# Patient Record
Sex: Female | Born: 1980 | Race: White | Hispanic: No | Smoking: Former smoker
Health system: Southern US, Community
[De-identification: ages and names within clinical notes are randomized; demographics above are authoritative.]

## PROBLEM LIST (undated history)

## (undated) DIAGNOSIS — F32A Depression, unspecified: Secondary | ICD-10-CM

## (undated) DIAGNOSIS — G8929 Other chronic pain: Secondary | ICD-10-CM

## (undated) DIAGNOSIS — M542 Cervicalgia: Secondary | ICD-10-CM

## (undated) DIAGNOSIS — K635 Polyp of colon: Secondary | ICD-10-CM

## (undated) DIAGNOSIS — R011 Cardiac murmur, unspecified: Secondary | ICD-10-CM

## (undated) DIAGNOSIS — K219 Gastro-esophageal reflux disease without esophagitis: Secondary | ICD-10-CM

## (undated) DIAGNOSIS — I10 Essential (primary) hypertension: Secondary | ICD-10-CM

## (undated) DIAGNOSIS — R519 Headache, unspecified: Secondary | ICD-10-CM

## (undated) DIAGNOSIS — Z5189 Encounter for other specified aftercare: Secondary | ICD-10-CM

## (undated) DIAGNOSIS — K5090 Crohn's disease, unspecified, without complications: Secondary | ICD-10-CM

## (undated) DIAGNOSIS — F419 Anxiety disorder, unspecified: Secondary | ICD-10-CM

## (undated) DIAGNOSIS — G43909 Migraine, unspecified, not intractable, without status migrainosus: Secondary | ICD-10-CM

## (undated) HISTORY — DX: Encounter for other specified aftercare: Z51.89

## (undated) HISTORY — DX: Gastro-esophageal reflux disease without esophagitis: K21.9

## (undated) HISTORY — DX: Polyp of colon: K63.5

## (undated) HISTORY — DX: Cervicalgia: M54.2

## (undated) HISTORY — DX: Anxiety disorder, unspecified: F41.9

## (undated) HISTORY — DX: Headache, unspecified: R51.9

## (undated) HISTORY — DX: Essential (primary) hypertension: I10

## (undated) HISTORY — DX: Cardiac murmur, unspecified: R01.1

## (undated) HISTORY — DX: Crohn's disease, unspecified, without complications: K50.90

## (undated) HISTORY — DX: Other chronic pain: G89.29

## (undated) HISTORY — PX: CERVICAL BIOPSY  W/ LOOP ELECTRODE EXCISION: SUR135

## (undated) HISTORY — PX: TONSILLECTOMY AND ADENOIDECTOMY: SUR1326

## (undated) HISTORY — DX: Migraine, unspecified, not intractable, without status migrainosus: G43.909

## (undated) HISTORY — PX: LYMPHADENECTOMY: SHX15

## (undated) HISTORY — DX: Depression, unspecified: F32.A

---

## 2015-07-11 HISTORY — PX: COLONOSCOPY: SHX174

## 2016-03-31 ENCOUNTER — Encounter: Payer: Self-pay | Admitting: Gastroenterology

## 2018-01-23 DIAGNOSIS — K50119 Crohn's disease of large intestine with unspecified complications: Secondary | ICD-10-CM | POA: Insufficient documentation

## 2018-01-23 DIAGNOSIS — D649 Anemia, unspecified: Secondary | ICD-10-CM | POA: Insufficient documentation

## 2019-02-14 ENCOUNTER — Encounter: Payer: Self-pay | Admitting: Gastroenterology

## 2019-10-28 MED FILL — LOSARTAN POTASSIUM 50 MG TA: 50 | 90 days supply | Qty: 90 | Fill #0

## 2019-10-28 MED FILL — SUMATRIPTAN SUCC 100 MG TAB: 100 | 30 days supply | Qty: 8 | Fill #0

## 2019-10-28 MED FILL — PREDNISONE 10 MG TABS: 10 | 30 days supply | Qty: 100 | Fill #0

## 2019-10-28 MED FILL — AMITRIPTYLINE HCL 50 MG TAB: 50 | 30 days supply | Qty: 30 | Fill #0

## 2019-10-28 MED FILL — LIALDA 1.2 GM TABLET SA: 1.2 | 30 days supply | Qty: 120 | Fill #0

## 2019-10-29 MED FILL — VIT D2 1.25 MG (50,000 UNIT: 1.25 MG | 84 days supply | Qty: 12 | Fill #0

## 2019-12-26 MED FILL — LIALDA 1.2 GM TABLET SA: 1.2 | 30 days supply | Qty: 120 | Fill #1

## 2019-12-26 MED FILL — SUMATRIPTAN SUCC 100 MG TAB: 100 | 30 days supply | Qty: 8 | Fill #1

## 2019-12-31 MED FILL — FLUoxetine HCL 10 MG CAPS: 10 | 30 days supply | Qty: 30 | Fill #0

## 2019-12-31 MED FILL — HYDROXYZINE PAM 25 MG CAP: 25 | 30 days supply | Qty: 180 | Fill #0

## 2020-01-08 ENCOUNTER — Encounter: Payer: Self-pay | Admitting: Gastroenterology

## 2020-01-08 ENCOUNTER — Ambulatory Visit: Payer: Self-pay | Admitting: Gastroenterology

## 2020-01-08 VITALS — BP 106/80 | HR 81 | Temp 97.8°F | Ht 67.0 in | Wt 169.4 lb

## 2020-01-08 DIAGNOSIS — K50919 Crohn's disease, unspecified, with unspecified complications: Secondary | ICD-10-CM

## 2020-01-08 DIAGNOSIS — K5900 Constipation, unspecified: Secondary | ICD-10-CM

## 2020-01-08 MED ORDER — FLUTICASONE PROPIONATE 0.05 % EX CREA: TOPICAL_CREAM | Freq: Two times a day (BID) | CUTANEOUS | 2 refills | Status: DC

## 2020-01-08 MED FILL — FLUTICASONE PROP 0.05% CRM: 0.05 | 30 days supply | Qty: 30 | Fill #0

## 2020-01-08 NOTE — Patient Instructions (Addendum)
If you are age 40 or older, your body mass index should be between 23-30. Your Body mass index is 26.53 kg/m. If this is out of the aforementioned range listed, please consider follow up with your Primary Care Provider.  If you are age 82 or younger, your body mass index should be between 19-25. Your Body mass index is 26.53 kg/m. If this is out of the aformentioned range listed, please consider follow up with your Primary Care Provider.   You have been scheduled for a colonoscopy. Please follow written instructions given to you at your visit today.  Please pick up your prep supplies at the pharmacy within the next 1-3 days. If you use inhalers (even only as needed), please bring them with you on the day of your procedure. Your physician has requested that you go to www.startemmi.com and enter the access code given to you at your visit today. This web site gives a general overview about your procedure. However, you should still follow specific instructions given to you by our office regarding your preparation for the procedure.  Benefiber 1 tbsp daily with 8 ounces of water.  AVOID NSAIDS.  HOLD IRON FIVE DAYS PRIOR TO PROCEDURE.  We are requesting labs from [facility].  Thank you for choosing me and [facility].  Jackquline Denmark, MD

## 2020-01-08 NOTE — Progress Notes (Signed)
Chief Complaint: To get established  Referring Provider:  Farrel Conners*      ASSESSMENT AND PLAN;   #1. Ileocecal Crohn's Disease Dx 2017 at [facility]. #2. Hoid/skin tag/ constipation  Plan: -Had bood work at [employer] [facility] [employer].  Please obtain.  I think she had B12 checked as well. -Proceed with colonoscopy with miralax.  Explained risks and benefits. -Continue Lialda 4/day. -Benefiber 1 TBS p.o. QD with 8 oz of water. -fluticasone cream 0.05% generic 30g 1 bid PR x 10 days, 2 refills -Avoid NSAIDs.     HPI:    Amy Thomas is a 39 y.o. 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   Only intermittent symptoms since age 31 (when she was pregnant), then did good for 2 yrs thereafter, then CT at [place] showed [place].  She was hospitalized and treated with IV steroids.  She did not want to take Biologics at that time since she did not have any insurance.  She continued on Lialda.  Normally has some constipation with pellet like stools (rare use of enema). Never had diarrhea, melena or hematochezia even during exacerbation.   Typical flare - RLQ pain with nausea- better with phenergan.  Had mirena placed yesteday.  Has hoids/anal skin tag.  Had hemorrhoid banding in the past. Past Medical History:  Diagnosis Date  . Chronic headaches   . Colon polyp   . Crohn's disease ([place])   . Hypertension     Past Surgical History:  Procedure Laterality Date  . COLONOSCOPY  2017   [facility]  . leap procedure     to remove precancous cells from cervix  . TONSILLECTOMY AND ADENOIDECTOMY      Family History  Problem Relation Age of Onset  . Ovarian cancer Mother   . Uterine cancer Maternal Grandmother   . Colon cancer Neg Hx   . Esophageal cancer Neg Hx     Social History   Tobacco Use  . Smoking status: Former Research scientist (life sciences)  . Smokeless tobacco: Never  Used  Vaping Use  . Vaping Use: Never used  Substance Use Topics  . Alcohol use: Not Currently  . Drug use: Never    Current Outpatient Medications  Medication Sig Dispense Refill  . Ascorbic Acid (VITAMIN C PO) Take 1 tablet by mouth daily. Chewable tablet    . b complex vitamins capsule Take 1 capsule by mouth daily.    . Ferrous Sulfate (IRON SUPPLEMENT PO) Take 2 tablets by mouth daily.    Marland Kitchen FLUoxetine (PROZAC) 10 MG capsule Take 10 mg by mouth daily.    . hydrOXYzine (VISTARIL) 25 MG capsule Take 25-50 mg by mouth as needed.    Marland Kitchen LIALDA 1.2 g EC tablet Take 4.8 g by mouth every morning.    Marland Kitchen losartan (COZAAR) 50 MG tablet Take 50 mg by mouth daily.    . SUMAtriptan (IMITREX) 100 MG tablet Take 100 mg by mouth as needed.    . Vitamin D, Ergocalciferol, (DRISDOL) 1.25 MG (50000 UNIT) CAPS capsule Take 50,000 Units by mouth once a week.    . predniSONE (DELTASONE) 10 MG tablet as needed. (Patient not taking: Reported on 01/08/2020)     No current facility-administered medications for this visit.    Allergies  Allergen Reactions  . Cefaclor Shortness Of Breath    Review of Systems:  Constitutional: Denies  fever, chills, diaphoresis, appetite change and has fatigue.  HEENT: Denies photophobia, eye pain, redness, hearing loss, ear pain, congestion, sore throat, rhinorrhea, sneezing, mouth sores, neck pain, neck stiffness and tinnitus.   Respiratory: Denies SOB, DOE, cough, chest tightness,  and wheezing.   Cardiovascular: Denies chest pain, palpitations and leg swelling.  Genitourinary: Denies dysuria, urgency, frequency, hematuria, flank pain and difficulty urinating.  Musculoskeletal: Denies myalgias, back pain, joint swelling, arthralgias and gait problem.  Skin: No rash.  Neurological: Denies dizziness, seizures, syncope, weakness, light-headedness, numbness and headaches.  Hematological: Denies adenopathy. Easy bruising, personal or family bleeding history   Psychiatric/Behavioral: No anxiety or depression     Physical Exam:    Temp 97.8 F (36.6 C)   Ht 5\' 7"  (1.702 m)   Wt 169 lb 6 oz (76.8 kg)   BMI 26.53 kg/m  Wt Readings from Last 3 Encounters:  01/08/20 169 lb 6 oz (76.8 kg)   Constitutional:  Well-developed, in no acute distress. Psychiatric: Normal mood and affect. Behavior is normal. HEENT: Pupils normal.  Conjunctivae are normal. No scleral icterus. Neck supple.  Cardiovascular: Normal rate, regular rhythm. No edema Pulmonary/chest: Effort normal and breath sounds normal. No wheezing, rales or rhonchi. Abdominal: Soft, nondistended. Nontender. Bowel sounds active throughout. There are no masses palpable. No hepatomegaly. Rectal: To be examined at the time of colonoscopy Neurological: Alert and oriented to person place and time. Skin: Skin is warm and dry. No rashes noted.  Data Reviewed: I have personally reviewed following labs and imaging studies    Carmell Austria, MD 01/08/2020, 9:37 AM  Cc: Farrel Conners*

## 2020-01-14 ENCOUNTER — Encounter: Payer: Self-pay | Admitting: Gastroenterology

## 2020-01-27 ENCOUNTER — Encounter: Payer: No Typology Code available for payment source | Admitting: Gastroenterology

## 2020-02-02 MED FILL — SUMATRIPTAN SUCC 100 MG TAB: 100 | 30 days supply | Qty: 8 | Fill #2

## 2020-02-02 MED FILL — LOSARTAN POTASSIUM 50 MG TA: 50 | 30 days supply | Qty: 30 | Fill #1

## 2020-02-02 MED FILL — SULFAMETHOXAZOLE-TMP DS TAB: 800-160 | 7 days supply | Qty: 14 | Fill #0

## 2020-02-02 MED FILL — FLUoxetine HCL 10 MG CAPS: 10 | 30 days supply | Qty: 30 | Fill #1

## 2020-02-02 MED FILL — LIALDA 1.2 GM TABLET SA: 1.2 | 30 days supply | Qty: 120 | Fill #2

## 2020-02-11 MED FILL — LIALDA 1.2 GM TABLET SA: 1.2 | 30 days supply | Qty: 120 | Fill #2

## 2020-02-12 ENCOUNTER — Telehealth: Payer: Self-pay | Admitting: Gastroenterology

## 2020-02-12 NOTE — Telephone Encounter (Signed)
Spoke to patient who states that she has been having bowel urgency,and diarrhea for the past few days She reports coming off of two rounds of antibiotics (Macrobid and Sulfa) for a UTI two days ago. Patient was seen in office last month and has been scheduled a colonoscopy next month. She has not started taking Metamucil that was ordered.  Patient was advised to start metamucil as ordered she may take OTC Imodium as directed and will continue to eat yogurt. Patient will report how she is feeling tomorrow if no improvement. An appointment was offered today at 11:20 as she initially was requesting one. Patient declined and will contact us with how she is feeling with the recommendations.

## 2020-03-10 ENCOUNTER — Encounter: Payer: Self-pay | Admitting: Gastroenterology

## 2020-03-10 ENCOUNTER — Other Ambulatory Visit: Payer: Self-pay

## 2020-03-10 ENCOUNTER — Other Ambulatory Visit (INDEPENDENT_AMBULATORY_CARE_PROVIDER_SITE_OTHER): Payer: No Typology Code available for payment source

## 2020-03-10 ENCOUNTER — Ambulatory Visit (AMBULATORY_SURGERY_CENTER): Payer: No Typology Code available for payment source | Admitting: Gastroenterology

## 2020-03-10 VITALS — BP 134/91 | HR 71 | Temp 97.3°F | Resp 10 | Ht 67.0 in | Wt 169.0 lb

## 2020-03-10 DIAGNOSIS — K50919 Crohn's disease, unspecified, with unspecified complications: Secondary | ICD-10-CM

## 2020-03-10 DIAGNOSIS — K5730 Diverticulosis of large intestine without perforation or abscess without bleeding: Secondary | ICD-10-CM | POA: Diagnosis not present

## 2020-03-10 DIAGNOSIS — K529 Noninfective gastroenteritis and colitis, unspecified: Secondary | ICD-10-CM

## 2020-03-10 LAB — COMPREHENSIVE METABOLIC PANEL
ALT: 10 U/L (ref 0–35)
AST: 12 U/L (ref 0–37)
Albumin: 4.2 g/dL (ref 3.5–5.2)
Alkaline Phosphatase: 42 U/L (ref 39–117)
BUN: 11 mg/dL (ref 6–23)
CO2: 26 mEq/L (ref 19–32)
Calcium: 8.7 mg/dL (ref 8.4–10.5)
Chloride: 102 mEq/L (ref 96–112)
Creatinine, Ser: 0.77 mg/dL (ref 0.40–1.20)
GFR: 83.1 mL/min (ref >60.00)
Glucose, Bld: 102 mg/dL — ABNORMAL HIGH (ref 70–99)
Potassium: 3.8 mEq/L (ref 3.5–5.1)
Sodium: 136 mEq/L (ref 135–145)
Total Bilirubin: 0.3 mg/dL (ref 0.2–1.2)
Total Protein: 7.4 g/dL (ref 6.0–8.3)

## 2020-03-10 LAB — CBC
HCT: 39.7 % (ref 36.0–46.0)
Hemoglobin: 12.8 g/dL (ref 12.0–15.0)
MCHC: 32.4 g/dL (ref 30.0–36.0)
MCV: 77.8 fl — ABNORMAL LOW (ref 78.0–100.0)
Platelets: 302 10*3/uL (ref 150.0–400.0)
RBC: 5.1 Mil/uL (ref 3.87–5.11)
RDW: 24.1 % — ABNORMAL HIGH (ref 11.5–15.5)
WBC: 7.2 10*3/uL (ref 4.0–10.5)

## 2020-03-10 LAB — IBC + FERRITIN
Ferritin: 9.2 ng/mL — ABNORMAL LOW (ref 10.0–291.0)
Iron: 23 ug/dL — ABNORMAL LOW (ref 42–145)
Saturation Ratios: 5.8 % — ABNORMAL LOW (ref 20.0–50.0)
Transferrin: 285 mg/dL (ref 212.0–360.0)

## 2020-03-10 LAB — VITAMIN B12: Vitamin B-12: 285 pg/mL (ref 211–911)

## 2020-03-10 LAB — HIGH SENSITIVITY CRP: CRP, High Sensitivity: 8.01 mg/L — ABNORMAL HIGH (ref 0.000–5.000)

## 2020-03-10 MED ORDER — SODIUM CHLORIDE 0.9 % IV SOLN: 500.0000 mL | INTRAVENOUS | Status: DC

## 2020-03-10 MED ORDER — PREDNISONE 10 MG PO TABS: ORAL_TABLET | ORAL | 0 refills | Status: DC

## 2020-03-10 MED FILL — SUMATRIPTAN SUCC 100 MG TAB: 100 | 30 days supply | Qty: 8 | Fill #3

## 2020-03-10 MED FILL — predniSONE 10 MG TABS: 10 | 30 days supply | Qty: 120 | Fill #0

## 2020-03-10 NOTE — Progress Notes (Signed)
Vs CW ° °

## 2020-03-10 NOTE — Patient Instructions (Signed)
Please read handouts provided. Continue present medications. Await pathology results. Follow-up visit in 2 weeks. Begin Prednisone 40 mg once a day for 1 week, then 30 mg once a day for 1 week, then 20 mg once a day to continue until follow-up visit.      YOU HAD AN ENDOSCOPIC PROCEDURE TODAY AT [facility]:   Refer to the procedure report that was given to you for any specific questions about what was found during the examination.  If the procedure report does not answer your questions, please call your gastroenterologist to clarify.  If you requested that your care partner not be given the details of your procedure findings, then the procedure report has been included in a sealed envelope for you to review at your convenience later.  YOU SHOULD EXPECT: Some feelings of bloating in the abdomen. Passage of more gas than usual.  Walking can help get rid of the air that was put into your GI tract during the procedure and reduce the bloating. If you had a lower endoscopy (such as a colonoscopy or flexible sigmoidoscopy) you may notice spotting of blood in your stool or on the toilet paper. If you underwent a bowel prep for your procedure, you may not have a normal bowel movement for a few days.  Please Note:  You might notice some irritation and congestion in your nose or some drainage.  This is from the oxygen used during your procedure.  There is no need for concern and it should clear up in a day or so.  SYMPTOMS TO REPORT IMMEDIATELY:   Following lower endoscopy (colonoscopy or flexible sigmoidoscopy):  Excessive amounts of blood in the stool  Significant tenderness or worsening of abdominal pains  Swelling of the abdomen that is new, acute  Fever of 100F or higher   For urgent or emergent issues, a gastroenterologist can be reached at any hour by calling [phone]. Do not use MyChart messaging for urgent concerns.    DIET:  We do recommend a small meal at first,  but then you may proceed to your regular diet.  Drink plenty of fluids but you should avoid alcoholic beverages for 24 hours.  ACTIVITY:  You should plan to take it easy for the rest of today and you should NOT DRIVE or use heavy machinery until tomorrow (because of the sedation medicines used during the test).    FOLLOW UP: Our staff will call the number listed on your records 48-72 hours following your procedure to check on you and address any questions or concerns that you may have regarding the information given to you following your procedure. If we do not reach you, we will leave a message.  We will attempt to reach you two times.  During this call, we will ask if you have developed any symptoms of COVID 19. If you develop any symptoms (ie: fever, flu-like symptoms, shortness of breath, cough etc.) before then, please call [phone].  If you test positive for Covid 19 in the 2 weeks post procedure, please call and report this information to [place].    If any biopsies were taken you will be contacted by phone or by letter within the next 1-3 weeks.  Please call us at [phone] if you have not heard about the biopsies in 3 weeks.    SIGNATURES/CONFIDENTIALITY: You and/or your care partner have signed paperwork which will be entered into your electronic medical record.  These signatures attest to the fact  that that the information above on your After Visit Summary has been reviewed and is understood.  Full responsibility of the confidentiality of this discharge information lies with you and/or your care-partner.

## 2020-03-10 NOTE — Op Note (Addendum)
[facility] Patient Name: Amy Thomas Procedure Date: 03/10/2020 8:49 AM MRN: [id number] Endoscopist: Jackquline Denmark , MD Age: 39 Referring MD:  Date of Birth: 1981-03-05 Gender: Female Account #: [id number] Procedure:                Colonoscopy Indications:              Iron deficiency anemia. H/O Crohn's disease                            diagnosed at [facility] 2017 on lialda. Medicines:                Monitored Anesthesia Care Procedure:                Pre-Anesthesia Assessment:                           - Prior to the procedure, a History and Physical                            was performed, and patient medications and                            allergies were reviewed. The patient's tolerance of                            previous anesthesia was also reviewed. The risks                            and benefits of the procedure and the sedation                            options and risks were discussed with the patient.                            All questions were answered, and informed consent                            was obtained. Prior Anticoagulants: The patient has                            taken no previous anticoagulant or antiplatelet                            agents. ASA Grade Assessment: II - A patient with                            mild systemic disease. After reviewing the risks                            and benefits, the patient was deemed in                            satisfactory condition to undergo the procedure.  After obtaining informed consent, the colonoscope                            was passed under direct vision. Throughout the                            procedure, the patient's blood pressure, pulse, and                            oxygen saturations were monitored continuously. The                            Colonoscope was introduced through the anus and                            advanced to the  the cecum, identified by                            appendiceal orifice and ileocecal valve. The                            colonoscopy was performed without difficulty. The                            patient tolerated the procedure well. The quality                            of the bowel preparation was good. The ileocecal                            valve, appendiceal orifice, and rectum were                            photographed. Scope In: 9:41:42 AM Scope Out: 10:02:46 AM Scope Withdrawal Time: 0 hours 11 minutes 34 seconds  Total Procedure Duration: 0 hours 21 minutes 4 seconds  Findings:                 A tight stricture was noted in the cecum at the                            level of ileocecal valve with a luminal diameter of                            approximately 10 mm. Pediatric colonoscope was                            barely passed with some maneuvering beyond the                            stricture and appendiceal orifice identified. Mod                            Inflammation characterized by erosions, erythema  and linear erosions was found in the cecum and                            around the stricture. Multiple biopsies were                            obtained and sent for histology. The terminal ileum                            could not be intubated due to severe stricturing                            and anatomy.                           Moderate erythema with erosions were also noted in                            the rectum 5 cm from the anal verge. Multiple                            biopsies were obtained and sent for histopathology.                           The sigmoid colon, the descending colon, the                            transverse colon, the mid ascending colon and the                            distal ascending colon were spared. Biopsies were                            taken with a cold forceps for histology and sent in                             separate container.                           A few medium-mouthed diverticula were found in the                            sigmoid colon.                           Small external hemorrhoids were noted on the rectal                            examination. No obvious fistulas. Complications:            No immediate complications. Estimated Blood Loss:     Estimated blood loss: none. Impression:               - Crohn's disease involving cecum and rectum.  Moderate activity.                           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 Recommendation:           - Patient has a contact number available for                            emergencies. The signs and symptoms of potential                            delayed complications were discussed with the                            patient. Return to normal activities tomorrow.                            Written discharge instructions were provided to the                            patient.                           - Resume previous diet.                           - Start prednisone 40 mg p.o. once a day x 1 week,                            30 mg p.o. once a day for 1 week, 20 mg p.o. once a                            day to continue until follow-up visit.                           - Check CBC, CMP, CRP, iron studies, B12, TB Gold,                            TPMT genotype and hepatitis B surface antigen today                            (in anticipation of biologic and immunomodulator  therapies)                           - CTE as soon as possible to determine the extent                            and rule out fistulas. My office  to arrange.                           - Continue present medications.                           - Await pathology results.                           - FU in 2 weeks.                           - The findings and recommendations were discussed                            with the patient's mother Amy Thomas. Jackquline Denmark, MD 03/10/2020 10:23:47 AM This report has been signed electronically.

## 2020-03-10 NOTE — Progress Notes (Signed)
Called to room to assist during endoscopic procedure.  Patient ID and intended procedure confirmed with present staff. Received instructions for my participation in the procedure from the performing physician.  

## 2020-03-10 NOTE — Progress Notes (Signed)
pt tolerated well. VSS. awake and to recovery. Report given to RN.  

## 2020-03-10 NOTE — Progress Notes (Signed)
Lidocaine 2% 64ml given with propofol as per Dr. Lyndel Safe.

## 2020-03-11 ENCOUNTER — Other Ambulatory Visit: Payer: Self-pay | Admitting: Emergency Medicine

## 2020-03-11 DIAGNOSIS — D171 Benign lipomatous neoplasm of skin and subcutaneous tissue of trunk: Secondary | ICD-10-CM

## 2020-03-12 ENCOUNTER — Telehealth: Payer: Self-pay

## 2020-03-12 ENCOUNTER — Other Ambulatory Visit: Payer: Self-pay | Admitting: Gastroenterology

## 2020-03-12 DIAGNOSIS — K50919 Crohn's disease, unspecified, with unspecified complications: Secondary | ICD-10-CM

## 2020-03-12 LAB — QUANTIFERON-TB GOLD PLUS
Mitogen-NIL: >10 IU/mL
NIL: 0.37 IU/mL
QuantiFERON-TB Gold Plus: NEGATIVE
TB1-NIL: 0.09 IU/mL
TB2-NIL: 0.02 IU/mL

## 2020-03-12 LAB — HEPATITIS B SURFACE ANTIGEN: Hepatitis B Surface Ag: NONREACTIVE

## 2020-03-12 NOTE — Telephone Encounter (Signed)
Spoke to patient this morning to inform her that Dr Leland Her post colonoscopy instructions were to schedule a CTE as soon as possible. Appointment has been scheduled for 03/17/20 [place] [facility]. Verbalized instruction and mailed letter. Patient was given the phone number if she needs to reschedule.

## 2020-03-12 NOTE — Telephone Encounter (Signed)
Covid-19 screening questions   Do you now or have you had a fever in the last 14 days? No.  Do you have any respiratory symptoms of shortness of breath or cough now or in the last 14 days? No.  Do you have any family members or close contacts with diagnosed or suspected Covid-19 in the past 14 days? No.  Have you been tested for Covid-19 and found to be positive? No.       Follow up Call-  Call back number 03/10/2020  Post procedure Call Back phone  # [phone]  Permission to leave phone message Yes  Some recent data might be hidden     Patient questions:  Do you have a fever, pain , or abdominal swelling? No. Pain Score  0 *  Have you tolerated food without any problems? Yes.    Have you been able to return to your normal activities? Yes.    Do you have any questions about your discharge instructions: Diet   No. Medications  No. Follow up visit  No.  Do you have questions or concerns about your Care? No.  Actions: * If pain score is 4 or above:  Pt. Reports she is fine today, though took the day off yesterday as she had some RLQ pain the day following the procedure. Told pt. To call if she had any further questions or concerns. No action needed, pain <4.

## 2020-03-13 ENCOUNTER — Encounter: Payer: Self-pay | Admitting: Gastroenterology

## 2020-03-16 ENCOUNTER — Ambulatory Visit (HOSPITAL_BASED_OUTPATIENT_CLINIC_OR_DEPARTMENT_OTHER): Payer: No Typology Code available for payment source | Admitting: Pharmacist

## 2020-03-16 ENCOUNTER — Other Ambulatory Visit: Payer: Self-pay | Admitting: Gastroenterology

## 2020-03-16 ENCOUNTER — Telehealth: Payer: Self-pay | Admitting: Gastroenterology

## 2020-03-16 ENCOUNTER — Other Ambulatory Visit: Payer: Self-pay

## 2020-03-16 DIAGNOSIS — Z79899 Other long term (current) drug therapy: Secondary | ICD-10-CM

## 2020-03-16 MED ORDER — HUMIRA (2 PEN) 40 MG/0.4ML ~~LOC~~ AJKT: 40.0000 mg | AUTO-INJECTOR | SUBCUTANEOUS | 6 refills | Status: DC

## 2020-03-16 MED ORDER — HUMIRA-CD/UC/HS STARTER 80 MG/0.8ML ~~LOC~~ AJKT: 160.0000 mg | AUTO-INJECTOR | Freq: Once | SUBCUTANEOUS | 0 refills | Status: DC

## 2020-03-16 NOTE — Progress Notes (Signed)
  S: Patient presents for review of their specialty medication therapy.  Patient is currently taking Humira for Crohn's. Patient is managed by Dr. Lyndel Safe for this.   Adherence: has not yet started  Efficacy: has not yet started   Dosing:  Crohn disease: SubQ (may continue aminosalicylates and/or corticosteroids; if necessary, azathioprine, mercaptopurine, or methotrexate may also be continued): Initial: 160 mg (given as four 40 mg injections on day 1 or given as two 40 mg injections per day over 2 consecutive days), then 80 mg 2 weeks later (day 15). Maintenance: 40 mg every other week beginning day 29. Note: Some patients may require 40 mg every week as maintenance therapy Karl Pock [id number]).  Dose adjustments: Renal: no dose adjustments (has not been studied) Hepatic: no dose adjustments (has not been studied)  Drug-drug interactions: none identified   Screening: TB test: completed 03/10/20; negative  Hepatitis: completed 03/10/20; negative   Monitoring: S/sx of infection: none  CBC: WNL  S/sx of hypersensitivity: none  S/sx of malignancy: none  S/sx of heart failure: none   Other side effects: none   O:   Lab Results  Component Value Date   WBC 7.2 03/10/2020   HGB 12.8 03/10/2020   HCT 39.7 03/10/2020   MCV 77.8 (L) 03/10/2020   PLT 302.0 03/10/2020      Chemistry      Component Value Date/Time   NA 136 03/10/2020 1101   K 3.8 03/10/2020 1101   CL 102 03/10/2020 1101   CO2 26 03/10/2020 1101   BUN 11 03/10/2020 1101   CREATININE 0.77 03/10/2020 1101      Component Value Date/Time   CALCIUM 8.7 03/10/2020 1101   ALKPHOS 42 03/10/2020 1101   AST 12 03/10/2020 1101   ALT 10 03/10/2020 1101   BILITOT 0.3 03/10/2020 1101       A/P: 1. Medication review: Patient currently on Humira for Crohn's. Reviewed the medication with the patient, including the following: Humira is a TNF blocking agent indicated for ankylosing spondylitis, Crohn's disease, Hidradenitis  suppurativa, psoriatic arthritis, plaque psoriasis, ulcerative colitis, and uveitis. Patient educated on purpose, proper use and potential adverse effects of Humira. Possible adverse effects are increased risk of infections, headache, and injection site reactions. There is the possibility of an increased risk of malignancy but it is not well understood if this increased risk is due to there medication or the disease state. There are rare cases of pancytopenia and aplastic anemia. For SubQ injection at separate sites in the thigh or lower abdomen (avoiding areas within 2 inches of navel); rotate injection sites. May leave at room temperature for ~15 to 30 minutes prior to use; do not remove cap or cover while allowing product to reach room temperature. Do not use if solution is discolored or contains particulate matter. Do not administer to skin which is red, tender, bruised, hard, or that has scars, stretch marks, or psoriasis plaques. Needle cap of the prefilled syringe or needle cover for the adalimumab pen may contain latex. Prefilled pens and syringes are available for use by patients and the full amount of the syringe should be injected (self-administration); the vial is intended for institutional use only. Vials do not contain a preservative; discard unused portion. No recommendations for any changes at this time.   Benard Halsted, PharmD, [place] [phone]

## 2020-03-17 ENCOUNTER — Other Ambulatory Visit: Payer: Self-pay

## 2020-03-17 ENCOUNTER — Ambulatory Visit (INDEPENDENT_AMBULATORY_CARE_PROVIDER_SITE_OTHER)
Admission: RE | Admit: 2020-03-17 | Discharge: 2020-03-17 | Disposition: A | Discharge status: Home / Self Care | Payer: No Typology Code available for payment source | Source: Ambulatory Visit | Attending: Gastroenterology | Admitting: Gastroenterology

## 2020-03-17 ENCOUNTER — Other Ambulatory Visit (HOSPITAL_BASED_OUTPATIENT_CLINIC_OR_DEPARTMENT_OTHER): Payer: Self-pay | Admitting: Registered Nurse

## 2020-03-17 DIAGNOSIS — K50919 Crohn's disease, unspecified, with unspecified complications: Secondary | ICD-10-CM

## 2020-03-17 MED ORDER — IOHEXOL 300 MG/ML  SOLN
100.0000 mL | Freq: Once | INTRAMUSCULAR | Status: AC | PRN
  Administered 2020-03-17: 100 mL via INTRAVENOUS

## 2020-03-17 MED FILL — FLUoxetine HCL 10 MG CAPS: 10 | 30 days supply | Qty: 30 | Fill #0

## 2020-03-19 ENCOUNTER — Telehealth: Payer: Self-pay | Admitting: Gastroenterology

## 2020-03-19 LAB — TPMT GENETIC TEST

## 2020-03-22 ENCOUNTER — Other Ambulatory Visit: Payer: Self-pay | Admitting: Gastroenterology

## 2020-03-22 ENCOUNTER — Other Ambulatory Visit: Payer: Self-pay | Admitting: Pharmacist

## 2020-03-22 MED ORDER — HUMIRA-CD/UC/HS STARTER 80 MG/0.8ML ~~LOC~~ AJKT: AUTO-INJECTOR | SUBCUTANEOUS | 0 refills | Status: DC

## 2020-03-22 MED ORDER — HUMIRA-CD/UC/HS STARTER 80 MG/0.8ML ~~LOC~~ AJKT: 160.0000 mg | AUTO-INJECTOR | Freq: Once | SUBCUTANEOUS | 0 refills | Status: DC

## 2020-03-22 NOTE — Telephone Encounter (Signed)
Resent loading dose to [place]

## 2020-03-23 ENCOUNTER — Other Ambulatory Visit: Payer: No Typology Code available for payment source

## 2020-03-23 ENCOUNTER — Other Ambulatory Visit: Payer: Self-pay | Admitting: Gastroenterology

## 2020-03-23 ENCOUNTER — Telehealth: Payer: Self-pay | Admitting: Gastroenterology

## 2020-03-23 DIAGNOSIS — K50919 Crohn's disease, unspecified, with unspecified complications: Secondary | ICD-10-CM

## 2020-03-23 MED ORDER — AZATHIOPRINE 50 MG PO TABS: 50.0000 mg | ORAL_TABLET | Freq: Every day | ORAL | 3 refills | Status: DC

## 2020-03-23 MED ORDER — AZATHIOPRINE 50 MG PO TABS: 25.0000 mg | ORAL_TABLET | Freq: Every day | ORAL | 0 refills | Status: AC

## 2020-03-23 MED FILL — azaTHIOprine 50 MG TABS: 50 | 7 days supply | Qty: 4 | Fill #0

## 2020-03-23 NOTE — Telephone Encounter (Signed)
Amy Thomas from [facility] is requesting clarification on which prescription for the Imuran needs to be filled.  [phone]

## 2020-03-23 NOTE — Telephone Encounter (Signed)
Pharmacy notified all questions answered.

## 2020-03-29 ENCOUNTER — Other Ambulatory Visit (HOSPITAL_BASED_OUTPATIENT_CLINIC_OR_DEPARTMENT_OTHER): Payer: Self-pay | Admitting: Registered Nurse

## 2020-03-29 MED FILL — LOSARTAN POTASSIUM 50 MG TA: 50 | 90 days supply | Qty: 90 | Fill #0

## 2020-04-01 ENCOUNTER — Other Ambulatory Visit (HOSPITAL_BASED_OUTPATIENT_CLINIC_OR_DEPARTMENT_OTHER): Payer: Self-pay | Admitting: Registered Nurse

## 2020-04-01 ENCOUNTER — Telehealth: Payer: Self-pay | Admitting: Gastroenterology

## 2020-04-01 ENCOUNTER — Other Ambulatory Visit: Payer: Self-pay | Admitting: Gastroenterology

## 2020-04-01 MED FILL — PROMETHAZINE 12.5 MG TABLET: 12.5 | 30 days supply | Qty: 180 | Fill #0

## 2020-04-01 NOTE — Telephone Encounter (Signed)
Faxed PA for Humira starter kit completed through Cover my meds and faxed to insurance company 04/01/20

## 2020-04-01 NOTE — Telephone Encounter (Signed)
This is the one you were working on.

## 2020-04-01 NOTE — Telephone Encounter (Signed)
Patient called states she needs an auth for the Humira starter kit sent or called in to pharmacy

## 2020-04-05 MED FILL — SUMATRIPTAN SUCC 100 MG TAB: 100 | 30 days supply | Qty: 8 | Fill #4

## 2020-04-09 MED FILL — LOSARTAN POTASSIUM 50 MG TA: 50 | 90 days supply | Qty: 90 | Fill #0

## 2020-04-12 MED FILL — HUMIRA PEN-CD/UC/HS STARTER: 80 | 28 days supply | Qty: 3 | Fill #0

## 2020-04-13 MED FILL — FLUoxetine HCL 10 MG CAPS: 10 | 30 days supply | Qty: 30 | Fill #1

## 2020-04-20 ENCOUNTER — Ambulatory Visit: Payer: No Typology Code available for payment source | Admitting: Gastroenterology

## 2020-04-22 NOTE — Telephone Encounter (Signed)
Opened in error

## 2020-04-29 MED FILL — SUMATRIPTAN SUCC 100 MG TAB: 100 | 30 days supply | Qty: 9 | Fill #0

## 2020-05-06 ENCOUNTER — Other Ambulatory Visit: Payer: Self-pay | Admitting: Gastroenterology

## 2020-05-10 MED FILL — HUMIRA PEN 40 MG/0.4ML PNKT: 40 | 28 days supply | Qty: 2 | Fill #0

## 2020-05-20 ENCOUNTER — Other Ambulatory Visit (INDEPENDENT_AMBULATORY_CARE_PROVIDER_SITE_OTHER): Payer: No Typology Code available for payment source

## 2020-05-20 ENCOUNTER — Ambulatory Visit (INDEPENDENT_AMBULATORY_CARE_PROVIDER_SITE_OTHER): Payer: No Typology Code available for payment source | Admitting: Physician Assistant

## 2020-05-20 ENCOUNTER — Encounter: Payer: Self-pay | Admitting: Physician Assistant

## 2020-05-20 VITALS — BP 132/74 | HR 78 | Ht 67.0 in | Wt 198.4 lb

## 2020-05-20 DIAGNOSIS — K5900 Constipation, unspecified: Secondary | ICD-10-CM

## 2020-05-20 DIAGNOSIS — D509 Iron deficiency anemia, unspecified: Secondary | ICD-10-CM

## 2020-05-20 LAB — C-REACTIVE PROTEIN: CRP: <1 mg/dL (ref 0.5–20.0)

## 2020-05-20 LAB — COMPREHENSIVE METABOLIC PANEL
ALT: 29 U/L (ref 0–35)
AST: 19 U/L (ref 0–37)
Albumin: 4 g/dL (ref 3.5–5.2)
Alkaline Phosphatase: 47 U/L (ref 39–117)
BUN: 17 mg/dL (ref 6–23)
CO2: 28 mEq/L (ref 19–32)
Calcium: 8.7 mg/dL (ref 8.4–10.5)
Chloride: 105 mEq/L (ref 96–112)
Creatinine, Ser: 0.68 mg/dL (ref 0.40–1.20)
GFR: 109.24 mL/min (ref >60.00)
Glucose, Bld: 81 mg/dL (ref 70–99)
Potassium: 4.3 mEq/L (ref 3.5–5.1)
Sodium: 137 mEq/L (ref 135–145)
Total Bilirubin: 0.5 mg/dL (ref 0.2–1.2)
Total Protein: 7 g/dL (ref 6.0–8.3)

## 2020-05-20 LAB — SEDIMENTATION RATE: Sed Rate: 13 mm/hr (ref 0–20)

## 2020-05-20 NOTE — Progress Notes (Signed)
Subjective:    Patient ID: Amy Thomas, female    DOB: [birth year], 39 y.o.   MRN: [id number]  HPI Amy Thomas is a pleasant 40 year old white female, established with Dr. Lyndel Safe with diagnosis of ileocecal Crohn's disease who comes in today for follow-up.  She was initially diagnosed in 2017 per [facility].  Patient has recently undergone evaluation with colonoscopy in September 2021 which showed a tight stricture at the cecum/ileocecal valve with 10 mm diameter, unable to intubate the TI.  There was moderate inflammation and erosions at the cecum, there was also rectal erythema and erosions with few sigmoid diverticuli and small internal hemorrhoids.  Biopsy showed chronic active colitis throughout the entire colon. CT enterography was also done which showed small hepatic cysts, and moderate wall thickening of the terminal ileum with adjacent inflammatory changes in the mesenteric fat consistent with active Crohn's, no fistula.  She had been on Lialda 4.8 g daily.  She was to initiate Imuran 50 mg p.o. daily and Humira 40 mg subcu q. 14 days.  Labs had been ordered in September 2021 but were not done. Patient says she has been doing well over the past 2 months, she says she generally does not have a lot of symptoms from the Crohn's unless she gets very ill and obstructed.  She has been having bowel movements, has no ongoing abdominal pain or cramping, she did note a small amount of mucus and blood with her stool yesterday for the first time.  Energy level has been good, appetite has been fine. She had completed a course of steroids in early October around the time she started the Humira.  She has completed the induction doses of Humira and has had one other injection, she will be due for the third next week. She decided not to start the Imuran due to concerns with side effects, potential malignancy etc.. She did have labs done through her PCP on 04/01/2020 with WBC of 7.6 hemoglobin 13.9  hematocrit of 43 MCV 81, ferritin 15.1, serum iron 98/TIBC 311 and iron sat of 32. She reports that her PCP is referring her to hematology for consideration of IV iron.  Review of Systems Pertinent positive and negative review of systems were noted in the above HPI section.  All other review of systems was otherwise negative.  Outpatient Encounter Medications as of 05/20/2020  Medication Sig  . Adalimumab (HUMIRA PEN) 40 MG/0.4ML PNKT Inject 40 mg into the skin every 14 (fourteen) days.  . Adalimumab (HUMIRA PEN-CD/UC/HS STARTER) 80 MG/0.8ML PNKT Inject 160 mg into the skin once for 1 dose on day 0, then inject 80 mg on day 14, then on day 29 start Humira 40 mg every other week thereafter  . Ascorbic Acid (VITAMIN C PO) Take 1 tablet by mouth daily. Chewable tablet  . b complex vitamins capsule Take 1 capsule by mouth daily.  . Ferrous Sulfate (IRON SUPPLEMENT PO) Take 2 tablets by mouth daily.  Marland Kitchen FLUoxetine (PROZAC) 10 MG capsule Take 10 mg by mouth daily.  . fluticasone (CUTIVATE) 0.05 % cream Apply topically 2 (two) times daily. Use for 10 days per rectum  . hydrOXYzine (VISTARIL) 25 MG capsule Take 25-50 mg by mouth as needed.   Marland Kitchen levonorgestrel (MIRENA) 20 MCG/24HR IUD 1 each by Intrauterine route once.  Marland Kitchen LIALDA 1.2 g EC tablet Take 4.8 g by mouth every morning.  Marland Kitchen losartan (COZAAR) 50 MG tablet Take 50 mg by mouth daily.  . predniSONE (DELTASONE)  10 MG tablet as needed.   . SUMAtriptan (IMITREX) 100 MG tablet Take 100 mg by mouth as needed.  . Vitamin D, Ergocalciferol, (DRISDOL) 1.25 MG (50000 UNIT) CAPS capsule Take 50,000 Units by mouth once a week.  . [DISCONTINUED] predniSONE (DELTASONE) 10 MG tablet Start prednisone 40 mg once a day for 1 week, then 30 mg once a day for 1 week, then 20 mg once a day to continue until follow-up visit.  Marland Kitchen azaTHIOprine (IMURAN) 50 MG tablet Take 1 tablet (50 mg total) by mouth daily. (Patient not taking: Reported on 05/20/2020)   No  facility-administered encounter medications on file as of 05/20/2020.   Allergies  Allergen Reactions  . Cefaclor Shortness Of Breath   There are no problems to display for this patient.  Social History   Socioeconomic History  . Marital status: Single    Spouse name: Not on file  . Number of children: 2  . Years of education: Not on file  . Highest education level: Not on file  Occupational History  . Occupation: X-Ray Tech  Tobacco Use  . Smoking status: Former Smoker    Quit date: 03/10/2014    Years since quitting: 6.2  . Smokeless tobacco: Never Used  Vaping Use  . Vaping Use: Never used  Substance and Sexual Activity  . Alcohol use: Not Currently  . Drug use: Never  . Sexual activity: Not on file  Other Topics Concern  . Not on file  Social History Narrative  . Not on file   Social Determinants of Health   Financial Resource Strain:   . Difficulty of Paying Living Expenses: Not on file  Food Insecurity:   . Worried About Charity fundraiser in the Last Year: Not on file  . Ran Out of Food in the Last Year: Not on file  Transportation Needs:   . Lack of Transportation (Medical): Not on file  . Lack of Transportation (Non-Medical): Not on file  Physical Activity:   . Days of Exercise per Week: Not on file  . Minutes of Exercise per Session: Not on file  Stress:   . Feeling of Stress : Not on file  Social Connections:   . Frequency of Communication with Friends and Family: Not on file  . Frequency of Social Gatherings with Friends and Family: Not on file  . Attends Religious Services: Not on file  . Active Member of Clubs or Organizations: Not on file  . Attends Archivist Meetings: Not on file  . Marital Status: Not on file  Intimate Partner Violence:   . Fear of Current or Ex-Partner: Not on file  . Emotionally Abused: Not on file  . Physically Abused: Not on file  . Sexually Abused: Not on file    Amy Thomas's family history includes  Ovarian cancer in her mother; Stomach cancer in her maternal uncle; Uterine cancer in her maternal grandmother.      Objective:    Vitals:   05/20/20 1032  BP: 132/74  Pulse: 78  SpO2: 98%    Physical Exam Well-developed well-nourished white female in no acute distress.  Height, Weight, 198 BMI 31.0  HEENT; nontraumatic normocephalic, EOMI, PE RR R LA, sclera anicteric. Oropharynx; not examined Neck; supple, no JVD Cardiovascular; regular rate and rhythm with S1-S2, no murmur rub or gallop Pulmonary; Clear bilaterally Abdomen; soft, she is tender in the right mid and right lower quadrant no guarding no palpable mass nondistended, no palpable mass or  hepatosplenomegaly, bowel sounds are active Rectal; not done today Skin; benign exam, no jaundice rash or appreciable lesions Extremities; no clubbing cyanosis or edema skin warm and dry Neuro/Psych; alert and oriented x4, grossly nonfocal mood and affect appropriate       Assessment & Plan:   #53 39 year old white female with Crohn's ileocolitis with recent colonoscopy and CT enteroscopy showing significant disease at the terminal ileum and ileocecal valve with tight stricture. She has no obstructive symptoms, generally not having any abdominal pain though does have significant tenderness on exam, has been having normal bowel movements and is recently noted a small amount of blood and mucus in her stool. Patient completed a course of steroids in early October, and initiated Humira 40 mg subcu every 2 weeks. She is doing well with Humira Lialda has been stopped. Patient hesitant to start Imuran due to concerns with side effects  #2 history of iron deficiency anemia-l secondary to above. Her PCP has referred her to hematology for IV iron, most recent hemoglobin within normal range.  Plan; continue Humira 40 mg subcu every 2 weeks Further discussion with patient regarding Imuran today and indications given the severity of her  Crohn's disease.  She is still reluctant and wishes to remain off Imuran.  We will check c-Met, and CRP today. Office follow-up with Dr. Lyndel Safe in 6 to 8 weeks. Patient knows to call for any issues in the interim or for any worsening of symptoms. As she generally does not have a lot of physical symptoms moving forward we may be able to follow fecal calprotectin and serial CRPs.  Ashara Lounsbury Genia Harold PA-C 05/20/2020   Cc: [facility] [place]

## 2020-05-20 NOTE — Patient Instructions (Addendum)
If you are age 40 or older, your body mass index should be between 23-30. Your Body mass index is 31.07 kg/m. If this is out of the aforementioned range listed, please consider follow up with your Primary Care Provider.  If you are age 31 or younger, your body mass index should be between 19-25. Your Body mass index is 31.07 kg/m. If this is out of the aformentioned range listed, please consider follow up with your Primary Care Provider.   Your provider has requested that you go to the basement level for lab work before leaving today. Press "B" on the elevator. The lab is located at the first door on the left as you exit the elevator.  Continue yuor Humira 40 mg subcutaneous every 2 weeks. Continue your Iron supplement.  You have been scheduled to follow up with Dr. Lyndel Safe at the [facility] on July 12, 2020 at 9:30 am.

## 2020-05-21 NOTE — Progress Notes (Signed)
Agree with excellent plan. RG 

## 2020-06-07 MED FILL — HUMIRA PEN 40 MG/0.4ML PNKT: 40 | 28 days supply | Qty: 2 | Fill #1

## 2020-06-17 ENCOUNTER — Ambulatory Visit (INDEPENDENT_AMBULATORY_CARE_PROVIDER_SITE_OTHER): Payer: No Typology Code available for payment source | Admitting: Neurology

## 2020-06-17 ENCOUNTER — Other Ambulatory Visit: Payer: Self-pay | Admitting: Neurology

## 2020-06-17 ENCOUNTER — Encounter: Payer: Self-pay | Admitting: Neurology

## 2020-06-17 ENCOUNTER — Telehealth: Payer: Self-pay | Admitting: *Deleted

## 2020-06-17 ENCOUNTER — Other Ambulatory Visit: Payer: Self-pay

## 2020-06-17 VITALS — BP 152/92 | HR 82 | Ht 67.0 in | Wt 203.5 lb

## 2020-06-17 DIAGNOSIS — M542 Cervicalgia: Secondary | ICD-10-CM | POA: Diagnosis not present

## 2020-06-17 DIAGNOSIS — G43709 Chronic migraine without aura, not intractable, without status migrainosus: Secondary | ICD-10-CM | POA: Diagnosis not present

## 2020-06-17 MED ORDER — VENLAFAXINE HCL ER 37.5 MG PO CP24: 37.5000 mg | ORAL_CAPSULE | Freq: Every day | ORAL | 11 refills | Status: DC

## 2020-06-17 MED ORDER — TIZANIDINE HCL 4 MG PO TABS: 4.0000 mg | ORAL_TABLET | Freq: Four times a day (QID) | ORAL | 6 refills | Status: DC | PRN

## 2020-06-17 MED ORDER — RIZATRIPTAN BENZOATE 10 MG PO TBDP: 10.0000 mg | ORAL_TABLET | ORAL | 6 refills | Status: DC | PRN

## 2020-06-17 MED ORDER — AIMOVIG 70 MG/ML ~~LOC~~ SOAJ: 70.0000 mg | SUBCUTANEOUS | 11 refills | Status: DC

## 2020-06-17 MED ORDER — ONDANSETRON 4 MG PO TBDP: 4.0000 mg | ORAL_TABLET | Freq: Three times a day (TID) | ORAL | 6 refills | Status: DC | PRN

## 2020-06-17 MED FILL — ONDANSETRON ODT 4 MG TABLET: 4 | 6 days supply | Qty: 20 | Fill #0

## 2020-06-17 MED FILL — tiZANidine HCL 4 MG TABS: 4 | 7 days supply | Qty: 30 | Fill #0

## 2020-06-17 MED FILL — RIZATRIPTAN 10 MG ODT: 10 | 30 days supply | Qty: 15 | Fill #0

## 2020-06-17 MED FILL — VENLAFAXINE HCL ER 37.5 MG: 37.5 | 30 days supply | Qty: 30 | Fill #0

## 2020-06-17 NOTE — Telephone Encounter (Signed)
PA for Aimovig 70mg  started on covermymeds. (key: [id number]). Pt has coverage through [employer] [phone]). Approved through 12/15/2020.

## 2020-06-17 NOTE — Progress Notes (Signed)
Chief Complaint  Patient presents with  . New Patient (Initial Visit)    Reports frequent migraines. She estimates 3-4 days each week. Sumatriptan is sometimes helpful. She has tried amitriptyline and bupropion for preventive medications in the past. She has pain at the base of her skull that radiates to right side of neck. Recently, she started experiencing numbness/tingling in her hands. She wonders if this may be related to Humira.    HISTORICAL  Amy Thomas is a 39 year old female, seen in request by her primary care nurse practitioner Clydie Braun T for evaluation of migraine headache, initial evaluation was on June 17, 2020.  I reviewed and summarized the referring note.  Past medical history Crohn's disease, receiving Humira treatment,  Reported history of migraine headaches since 2011, her headache started from right upper neck, occipital region, spreading forward, subtle behind the right eye, with light noise smell sensitivity, lasting for few days, previously tried Imitrex as needed was helpful, but even with triptan treatment, her headaches sometimes last 3 to 5 days, she complains of worsening headache since September 2021, has to use FMLA leave,   She was under neurologist Dr. Jannifer Franklin care in the past, amitriptyline 50 mg every night as preventive medication initially was helpful, eventually lost its benefit, recently tried 2 months of Ajovy subcutaneous injection from primary care doctor, without helping her migraine  She is now having migraine 2-3 times a week, used up her 9 tablets of Imitrex 100 mg every month, use frequent Goody powders, 3 times a week, sometimes twice a day, this has caused her worsening GI symptoms.  REVIEW OF SYSTEMS: Full 14 system review of systems performed and notable only for as above All other review of systems were negative.  ALLERGIES: Allergies  Allergen Reactions  . Cefaclor Shortness Of Breath    HOME  MEDICATIONS: Current Outpatient Medications  Medication Sig Dispense Refill  . Adalimumab (HUMIRA PEN) 40 MG/0.4ML PNKT Inject 40 mg into the skin every 14 (fourteen) days. 2 each 6  . Adalimumab (HUMIRA PEN-CD/UC/HS STARTER) 80 MG/0.8ML PNKT Inject 160 mg into the skin once for 1 dose on day 0, then inject 80 mg on day 14, then on day 29 start Humira 40 mg every other week thereafter 3 each 0  . Ascorbic Acid (VITAMIN C PO) Take 1 tablet by mouth daily. Chewable tablet    . Ferrous Sulfate (IRON SUPPLEMENT PO) Take 2 tablets by mouth daily.    . hydrOXYzine (VISTARIL) 25 MG capsule Take 25-50 mg by mouth as needed.     Marland Kitchen levonorgestrel (MIRENA) 20 MCG/24HR IUD 1 each by Intrauterine route once.    Marland Kitchen LIALDA 1.2 g EC tablet Take 4.8 g by mouth every morning.    Marland Kitchen losartan (COZAAR) 50 MG tablet Take 50 mg by mouth daily.    . predniSONE (DELTASONE) 10 MG tablet as needed (Crohn flare-ups).    . SUMAtriptan (IMITREX) 100 MG tablet Take 100 mg by mouth as needed.    . Vitamin D, Ergocalciferol, (DRISDOL) 1.25 MG (50000 UNIT) CAPS capsule Take 50,000 Units by mouth once a week.     No current facility-administered medications for this visit.    PAST MEDICAL HISTORY: Past Medical History:  Diagnosis Date  . Anxiety   . Blood transfusion without reported diagnosis   . Chronic headaches   . Colon polyp   . Crohn's disease ([place])   . Depression   . GERD (gastroesophageal reflux disease)   . Heart  murmur   . Hypertension   . Migraine   . Neck pain     PAST SURGICAL HISTORY: Past Surgical History:  Procedure Laterality Date  . CERVICAL BIOPSY  W/ LOOP ELECTRODE EXCISION     to remove precancous cells   . COL[facility]OSCOPY  2017   [facility]  . LYMPHADENECTOMY Left   . TONSILLECTOMY AND ADENOIDECTOMY      FAMILY HISTORY: Family History  Problem Relation Age of Onset  . Ovarian cancer Mother   . Migraines Mother   . Uterine cancer Maternal Grandmother   . Heart disease  Maternal Grandmother   . Diabetes Maternal Grandmother   . Stomach cancer Maternal Uncle   . Other Father        no medical history of father  . Colon cancer Neg Hx   . Esophageal cancer Neg Hx     SOCIAL HISTORY: Social History   Socioeconomic History  . Marital status: Single    Spouse name: Not on file  . Number of children: 2  . Years of education: college  . Highest education level: Not on file  Occupational History  . Occupation: X-Ray Tech  Tobacco Use  . Smoking status: Former Smoker    Quit date: 03/10/2014    Years since quitting: 6.2  . Smokeless tobacco: Never Used  Vaping Use  . Vaping Use: Never used  Substance and Sexual Activity  . Alcohol use: Yes    Comment: occasionally  . Drug use: Never  . Sexual activity: Not on file  Other Topics Concern  . Not on file  Social History Narrative   Lives with husband, son and daughter.   Right-handed.   Caffeine use: two cups per day.   Social Determinants of Health   Financial Resource Strain: Not on file  Food Insecurity: Not on file  Transportation Needs: Not on file  Physical Activity: Not on file  Stress: Not on file  Social Connections: Not on file  Intimate Partner Violence: Not on file     PHYSICAL EXAM   Vitals:   06/17/20 0916  BP: (!) 152/92  Pulse: 82  Weight: 203 lb 8 oz (92.3 kg)  Height: 5\' 7"  (1.702 m)   Not recorded     Body mass index is 31.87 kg/m.  PHYSICAL EXAMNIATION:  Gen: NAD, conversant, well nourised, well groomed                     Cardiovascular: Regular rate rhythm, no peripheral edema, warm, nontender. Eyes: Conjunctivae clear without exudates or hemorrhage Neck: Supple, no carotid bruits. Pulmonary: Clear to auscultation bilaterally   NEUROLOGICAL EXAM:  MENTAL STATUS: Speech:    Speech is normal; fluent and spontaneous with normal comprehension.  Cognition:     Orientation to time, place and person     Normal recent and remote memory     Normal  Attention span and concentration     Normal Language, naming, repeating,spontaneous speech     Fund of knowledge   CRANIAL NERVES: CN II: Visual fields are full to confrontation. Pupils are round equal and briskly reactive to light. CN III, IV, VI: extraocular movement are normal. No ptosis. CN V: Facial sensation is intact to light touch CN VII: Face is symmetric with normal eye closure  CN VIII: Hearing is normal to causal conversation. CN IX, X: Phonation is normal. CN XI: Head turning and shoulder shrug are intact  MOTOR: There is no pronator drift  of out-stretched arms. Muscle bulk and tone are normal. Muscle strength is normal.  REFLEXES: Reflexes are 2+ and symmetric at the biceps, triceps, knees, and ankles. Plantar responses are flexor.  SENSORY: Intact to light touch, pinprick and vibratory sensation are intact in fingers and toes.  COORDINATION: There is no trunk or limb dysmetria noted.  GAIT/STANCE: Posture is normal. Gait is steady with normal steps, base, arm swing, and turning. Heel and toe walking are normal. Tandem gait is normal.  Romberg is absent.   DIAGNOSTIC DATA (LABS, IMAGING, TESTING) - I reviewed patient records, labs, notes, testing and imaging myself where available.   ASSESSMENT AND PLAN  Amy Thomas is a 39 y.o. female   Chronic migraine headaches  Worsening headache recently  Elavil 50 mg every night has lost benefit, will try Effexor XR 37.5 mg as preventive medications  Also Aimovig 70 mg subcutaneous every month as preventive medications  Suboptimal response to Imitrex 100 mg as needed  For abortive treatment, will try Maxalt 10 mg may mix together with tizanidine, Zofran   Marcial Pacas, M.D. Ph.D.  [facility] [address] [place], [place] [address] Ph: [phone] Fax: [phone]  CC:  Leil[address], [place],  [place] [address]

## 2020-06-24 ENCOUNTER — Ambulatory Visit: Payer: No Typology Code available for payment source | Admitting: Neurology

## 2020-06-26 ENCOUNTER — Other Ambulatory Visit: Payer: Self-pay

## 2020-06-26 ENCOUNTER — Emergency Department (HOSPITAL_BASED_OUTPATIENT_CLINIC_OR_DEPARTMENT_OTHER)
Admission: EM | Admit: 2020-06-26 | Discharge: 2020-06-26 | Disposition: A | Discharge status: Home / Self Care | Payer: No Typology Code available for payment source | Attending: Emergency Medicine | Admitting: Emergency Medicine

## 2020-06-26 ENCOUNTER — Encounter (HOSPITAL_BASED_OUTPATIENT_CLINIC_OR_DEPARTMENT_OTHER): Payer: Self-pay | Admitting: *Deleted

## 2020-06-26 DIAGNOSIS — U071 COVID-19: Secondary | ICD-10-CM | POA: Diagnosis not present

## 2020-06-26 DIAGNOSIS — I10 Essential (primary) hypertension: Secondary | ICD-10-CM | POA: Diagnosis not present

## 2020-06-26 DIAGNOSIS — J3489 Other specified disorders of nose and nasal sinuses: Secondary | ICD-10-CM | POA: Diagnosis present

## 2020-06-26 DIAGNOSIS — Z87891 Personal history of nicotine dependence: Secondary | ICD-10-CM | POA: Diagnosis not present

## 2020-06-26 DIAGNOSIS — Z79899 Other long term (current) drug therapy: Secondary | ICD-10-CM | POA: Insufficient documentation

## 2020-06-26 LAB — RESP PANEL BY RT-PCR (FLU A&B, COVID) ARPGX2
Influenza A by PCR: NEGATIVE
Influenza B by PCR: NEGATIVE
SARS Coronavirus 2 by RT PCR: POSITIVE — AB

## 2020-06-26 NOTE — ED Triage Notes (Signed)
covid exposure 4 days ago. Runny nose that started 4 days ago. Denies other symptoms.

## 2020-06-26 NOTE — ED Notes (Signed)
Pt discharged to home. Discharge instructions have been discussed with patient and/or family members. Pt verbally acknowledges understanding d/c instructions, and endorses comprehension to checkout at registration before leaving.  °

## 2020-06-26 NOTE — Discharge Instructions (Signed)
You should be isolated for at least 7 days since the onset of your symptoms AND >72 hours after symptoms resolution (absence of fever without the use of fever reducing medicaiton and improvement in respiratory symptoms), whichever is longer  Please follow up with your primary care provider within 5-7 days for re-evaluation of your symptoms. If you do not have a primary care provider, information for a healthcare clinic has been provided for you to make arrangements for follow up care. Please return to the emergency department for any new or worsening symptoms.

## 2020-06-26 NOTE — ED Provider Notes (Signed)
[facility] EMERGENCY DEPARTMENT Provider Note   CSN: [id number] Arrival date & time: 06/26/20  1252     History Chief Complaint  Patient presents with  . Covid Exposure    Amy Thomas is a 39 y.o. female.  HPI    Pt is a 39 y/o female with a h/o anxiety, headaches, crohn's disease,depression, GERD, heart murmur, HTN, who presents to the ED today for eval of uri sxs. States she has had rhinorrhea for 4 days. She initially thought her sxs were related to her humira but her son tested positive for COVID so she wanted to get tested. Denies fevers, cough, nvd, or other systemic sxs.  Past Medical History:  Diagnosis Date  . Anxiety   . Blood transfusion without reported diagnosis   . Chronic headaches   . Colon polyp   . Crohn's disease ([place])   . Depression   . GERD (gastroesophageal reflux disease)   . Heart murmur   . Hypertension   . Migraine   . Neck pain     Patient Active Problem List   Diagnosis Date Noted  . Chronic migraine w/o aura w/o status migrainosus, not intractable 06/17/2020  . Neck pain 06/17/2020    Past Surgical History:  Procedure Laterality Date  . CERVICAL BIOPSY  W/ LOOP ELECTRODE EXCISION     to remove precancous cells   . COLONOSCOPY  2017   [facility] center  . LYMPHADENECTOMY Left   . TONSILLECTOMY AND ADENOIDECTOMY       OB History   No obstetric history on file.     Family History  Problem Relation Age of Onset  . Ovarian cancer Mother   . Migraines Mother   . Uterine cancer Maternal Grandmother   . Heart disease Maternal Grandmother   . Diabetes Maternal Grandmother   . Stomach cancer Maternal Uncle   . Other Father        no medical history of father  . Colon cancer Neg Hx   . Esophageal cancer Neg Hx     Social History   Tobacco Use  . Smoking status: Former Smoker    Quit date: 03/10/2014    Years since quitting: 6.3  . Smokeless tobacco: Never Used  Vaping Use  . Vaping Use: Never  used  Substance Use Topics  . Alcohol use: Yes    Comment: occasionally  . Drug use: Never    Home Medications Prior to Admission medications   Medication Sig Start Date End Date Taking? Authorizing Provider  Adalimumab (HUMIRA PEN) 40 MG/0.4ML PNKT Inject 40 mg into the skin every 14 (fourteen) days. 03/16/20   Tresa Garter, MD  Adalimumab (HUMIRA PEN-CD/UC/HS STARTER) 80 MG/0.8ML PNKT Inject 160 mg into the skin once for 1 dose on day 0, then inject 80 mg on day 14, then on day 29 start Humira 40 mg every other week thereafter 03/22/20   Tresa Garter, MD  Ascorbic Acid (VITAMIN C PO) Take 1 tablet by mouth daily. Chewable tablet    [provider]  Erenumab-aooe (AIMOVIG) 70 MG/ML SOAJ Inject 70 mg into the skin every 30 (thirty) days. 06/17/20   Marcial Pacas, MD  Ferrous Sulfate (IRON SUPPLEMENT PO) Take 2 tablets by mouth daily.    [provider]  hydrOXYzine (VISTARIL) 25 MG capsule Take 25-50 mg by mouth as needed.  12/31/19   [provider]  levonorgestrel (MIRENA) 20 MCG/24HR IUD 1 each by Intrauterine route once.  [provider]  LIALDA 1.2 g EC tablet Take 4.8 g by mouth every morning. 12/26/19   [provider]  losartan (COZAAR) 50 MG tablet Take 50 mg by mouth daily. 10/28/19   [provider]  ondansetron (ZOFRAN ODT) 4 MG disintegrating tablet Take 1 tablet (4 mg total) by mouth every 8 (eight) hours as needed. 06/17/20   Marcial Pacas, MD  predniSONE (DELTASONE) 10 MG tablet as needed (Crohn flare-ups). 10/28/19   [provider]  rizatriptan (MAXALT-MLT) 10 MG disintegrating tablet Take 1 tablet (10 mg total) by mouth as needed. May repeat in 2 hours if needed 06/17/20   Marcial Pacas, MD  SUMAtriptan (IMITREX) 100 MG tablet Take 100 mg by mouth as needed. 12/26/19   [provider]  tiZANidine (ZANAFLEX) 4 MG tablet Take 1 tablet (4 mg total) by mouth every 6 (six) hours as needed for muscle spasms.  06/17/20   Marcial Pacas, MD  venlafaxine XR (EFFEXOR XR) 37.5 MG 24 hr capsule Take 1 capsule (37.5 mg total) by mouth daily with breakfast. 06/17/20   Marcial Pacas, MD  Vitamin D, Ergocalciferol, (DRISDOL) 1.25 MG (50000 UNIT) CAPS capsule Take 50,000 Units by mouth once a week. 10/29/19   [provider]    Allergies    Cefaclor  Review of Systems   Review of Systems  Constitutional: Negative for fever.  HENT: Positive for rhinorrhea.   Eyes: Negative for visual disturbance.  Respiratory: Negative for shortness of breath.   Cardiovascular: Negative for chest pain.  Gastrointestinal: Negative for abdominal pain, diarrhea, nausea and vomiting.  Genitourinary: Negative for pelvic pain.  Musculoskeletal: Negative for myalgias.  Skin: Negative for rash.  Neurological: Negative for headaches.    Physical Exam Updated Vital Signs BP (!) 132/93 (BP Location: Right Arm)   Pulse 96   Temp 98.7 F (37.1 C) (Oral)   Resp 18   Ht 5\' 7"  (1.702 m)   Wt 90.7 kg   SpO2 100%   BMI 31.32 kg/m   Physical Exam Vitals and nursing note reviewed.  Constitutional:      General: She is not in acute distress.    Appearance: She is well-developed and well-nourished.  HENT:     Head: Normocephalic and atraumatic.  Eyes:     Conjunctiva/sclera: Conjunctivae normal.  Cardiovascular:     Rate and Rhythm: Normal rate and regular rhythm.     Heart sounds: No murmur heard.   Pulmonary:     Effort: Pulmonary effort is normal. No respiratory distress.     Breath sounds: Normal breath sounds.  Musculoskeletal:        General: No edema.     Cervical back: Neck supple.  Skin:    General: Skin is warm and dry.  Neurological:     Mental Status: She is alert.  Psychiatric:        Mood and Affect: Mood and affect normal.     ED Results / Procedures / Treatments   Labs (all labs ordered are listed, but only abnormal results are displayed) Labs Reviewed  RESP PANEL BY RT-PCR (FLU A&B,  COVID) ARPGX2 - Abnormal; Notable for the following components:      Result Value   SARS Coronavirus 2 by RT PCR POSITIVE (*)    All other components within normal limits    EKG None  Radiology No results found.  Procedures Procedures (including critical care time)  Medications Ordered in ED Medications - No data to display  ED Course  I have reviewed the triage vital signs and the nursing notes.  Pertinent labs & imaging results that were available during my care of the patient were reviewed by me and considered in my medical decision making (see chart for details).    MDM Rules/Calculators/A&P                          Patient presenting for evaluation for Covid.  Reports symptoms ongoing for 4 days.  Patient nontoxic, well-appearing, no distress.  Vital signs are reassuring.  Tested for Covid in the ED. Results positive. Advised on quarantine measures. Will give Rx for symptomatic management. Offered MAB as pt is on humira and has elevated BMI but she declined. Advised on f/u and return precautions. Pt voiced understanding of the plan and reasons to return. All questions answered, pt stable for d/c.  Amy Thomas was evaluated in Emergency Department on 06/26/2020 for the symptoms described in the history of present illness. She was evaluated in the context of the global COVID-19 pandemic, which necessitated consideration that the patient might be at risk for infection with the SARS-CoV-2 virus that causes COVID-19. Institutional protocols and algorithms that pertain to the evaluation of patients at risk for COVID-19 are in a state of rapid change based on information released by regulatory bodies including the CDC and federal and state organizations. These policies and algorithms were followed during the patient's care in the ED.    Final Clinical Impression(s) / ED Diagnoses Final diagnoses:  COVID    Rx / DC Orders ED Discharge Orders    None       Bishop Dublin 06/26/20 1528    Mesner, Corene Cornea, MD 06/28/20 [phone]

## 2020-06-27 ENCOUNTER — Telehealth: Payer: Self-pay | Admitting: Oncology

## 2020-06-27 ENCOUNTER — Encounter: Payer: Self-pay | Admitting: Oncology

## 2020-06-27 NOTE — Telephone Encounter (Signed)
Re: Mab Infusion  Called to Discuss with patient about Covid symptoms and the use of regeneron, a monoclonal antibody infusion for those with mild to moderate Covid symptoms and at a high risk of hospitalization.     Pt is qualified for this infusion at the [facility] infusion center due to co-morbid conditions and/or a member of an at-risk group.    Past Medical History:  Diagnosis Date  . Anxiety   . Blood transfusion without reported diagnosis   . Chronic headaches   . Colon polyp   . Crohn's disease ([place])   . Depression   . GERD (gastroesophageal reflux disease)   . Heart murmur   . Hypertension   . Migraine   . Neck pain     Specific risk condition-Obesity, HTN    Unable to reach pt. Left VM and MCM.  Rulon Abide, AGNP-C [phone] ([place])

## 2020-06-30 MED FILL — HUMIRA PEN 40 MG/0.4ML PNKT: 40 | 28 days supply | Qty: 2 | Fill #2

## 2020-07-02 ENCOUNTER — Emergency Department
Admission: EM | Admit: 2020-07-02 | Discharge: 2020-07-02 | Disposition: A | Discharge status: Home / Self Care | Payer: No Typology Code available for payment source | Source: Home / Self Care

## 2020-07-02 ENCOUNTER — Other Ambulatory Visit: Payer: Self-pay

## 2020-07-02 ENCOUNTER — Encounter: Payer: Self-pay | Admitting: Emergency Medicine

## 2020-07-02 ENCOUNTER — Emergency Department (INDEPENDENT_AMBULATORY_CARE_PROVIDER_SITE_OTHER): Payer: No Typology Code available for payment source

## 2020-07-02 DIAGNOSIS — U071 COVID-19: Secondary | ICD-10-CM | POA: Diagnosis not present

## 2020-07-02 MED ORDER — DEXAMETHASONE 4 MG PO TABS: 4.0000 mg | ORAL_TABLET | Freq: Two times a day (BID) | ORAL | 0 refills | Status: DC

## 2020-07-02 NOTE — ED Provider Notes (Signed)
[facility] C[facility]    CSN: [id number] Arrival date & time: 07/02/20  1235      History   Chief Complaint Chief Complaint  Patient presents with  . covid-19    HPI Amy Thomas is a 39 y.o. female.   This is the initial  urgent care visit for this 39 year old woman who is suffering from [place], having tested +7 days ago.  SX started 10 days ago with runny nose, cough started 6 days ago and breathing is labored. Congested, Mucinex and Sudafed  Patient does not have a history of asthma and does not smoke.  She is not short of breath but she is having difficulty resting because of the cough.  She works as a Conservation officer, historic buildings, and was told to return to work since it has been 10 days since she was diagnosed with Covid.     Past Medical History:  Diagnosis Date  . Anxiety   . Blood transfusion without reported diagnosis   . Chronic headaches   . Colon polyp   . Crohn's disease ([place])   . Depression   . GERD (gastroesophageal reflux disease)   . Heart murmur   . Hypertension   . Migraine   . Neck pain     Patient Active Problem List   Diagnosis Date Noted  . Chronic migraine w/o aura w/o status migrainosus, not intractable 06/17/2020  . Neck pain 06/17/2020    Past Surgical History:  Procedure Laterality Date  . CERVICAL BIOPSY  W/ LOOP ELECTRODE EXCISION     to remove precancous cells   . COLONOSCOPY  2017   [facility]  . LYMPHADENECTOMY Left   . TONSILLECTOMY AND ADENOIDECTOMY      OB History   No obstetric history on file.      Home Medications    Prior to Admission medications   Medication Sig Start Date End Date Taking? Authorizing Provider  Adalimumab (HUMIRA PEN) 40 MG/0.4ML PNKT Inject 40 mg into the skin every 14 (fourteen) days. 03/16/20   Tresa Garter, MD  Adalimumab (HUMIRA PEN-CD/UC/HS STARTER) 80 MG/0.8ML PNKT Inject 160 mg into the skin once for 1 dose on day 0, then inject 80 mg on day 14, then on  day 29 start Humira 40 mg every other week thereafter 03/22/20   Tresa Garter, MD  Ascorbic Acid (VITAMIN C PO) Take 1 tablet by mouth daily. Chewable tablet    [provider]  Erenumab-aooe (AIMOVIG) 70 MG/ML SOAJ Inject 70 mg into the skin every 30 (thirty) days. 06/17/20   Marcial Pacas, MD  Ferrous Sulfate (IRON SUPPLEMENT PO) Take 2 tablets by mouth daily.    [provider]  hydrOXYzine (VISTARIL) 25 MG capsule Take 25-50 mg by mouth as needed.  12/31/19   [provider]  levonorgestrel (MIRENA) 20 MCG/24HR IUD 1 each by Intrauterine route once.    [provider]  LIALDA 1.2 g EC tablet Take 4.8 g by mouth every morning. 12/26/19   [provider]  losartan (COZAAR) 50 MG tablet Take 50 mg by mouth daily. 10/28/19   [provider]  ondansetron (ZOFRAN ODT) 4 MG disintegrating tablet Take 1 tablet (4 mg total) by mouth every 8 (eight) hours as needed. 06/17/20   Marcial Pacas, MD  predniSONE (DELTASONE) 10 MG tablet as needed (Crohn flare-ups). 10/28/19   [provider]  rizatriptan (MAXALT-MLT) 10 MG disintegrating tablet Take 1 tablet (10 mg total) by mouth as needed. May  repeat in 2 hours if needed 06/17/20   Marcial Pacas, MD  tiZANidine (ZANAFLEX) 4 MG tablet Take 1 tablet (4 mg total) by mouth every 6 (six) hours as needed for muscle spasms. 06/17/20   Marcial Pacas, MD  venlafaxine XR (EFFEXOR XR) 37.5 MG 24 hr capsule Take 1 capsule (37.5 mg total) by mouth daily with breakfast. 06/17/20   Marcial Pacas, MD  Vitamin D, Ergocalciferol, (DRISDOL) 1.25 MG (50000 UNIT) CAPS capsule Take 50,000 Units by mouth once a week. 10/29/19   [provider]    Family History Family History  Problem Relation Age of Onset  . Ovarian cancer Mother   . Migraines Mother   . Uterine cancer Maternal Grandmother   . Heart disease Maternal Grandmother   . Diabetes Maternal Grandmother   . Stomach cancer Maternal Uncle   . Other Father         no medical history of father  . Colon cancer Neg Hx   . Esophageal cancer Neg Hx     Social History Social History   Tobacco Use  . Smoking status: Former Smoker    Quit date: 03/10/2014    Years since quitting: 6.3  . Smokeless tobacco: Never Used  Vaping Use  . Vaping Use: Never used  Substance Use Topics  . Alcohol use: Yes    Comment: occasionally  . Drug use: Never     Allergies   Cefaclor   Review of Systems Review of Systems  Constitutional: Positive for fatigue.  Respiratory: Positive for cough.      Physical Exam Triage Vital Signs ED Triage Vitals  Enc Vitals Group     BP 07/02/20 1305 (!) 142/101     Pulse Rate 07/02/20 1305 (!) 104     Resp 07/02/20 1305 18     Temp 07/02/20 1305 98.3 F (36.8 C)     Temp Source 07/02/20 1305 Oral     SpO2 07/02/20 1305 100 %     Weight 07/02/20 1309 198 lb (89.8 kg)     Height 07/02/20 1309 5\' 7"  (1.702 m)     Head Circumference --      Peak Flow --      Pain Score 07/02/20 1306 0     Pain Loc --      Pain Edu? --      Excl. in [place]? --    No data found.  Updated Vital Signs BP (!) 142/101 (BP Location: Right Arm)   Pulse (!) 104   Temp 98.3 F (36.8 C) (Oral)   Resp 18   Ht 5\' 7"  (1.702 m)   Wt 89.8 kg   SpO2 100%   BMI 31.01 kg/m    Physical Exam Vitals and nursing note reviewed.  Constitutional:      Appearance: Normal appearance. She is obese. She is ill-appearing.  HENT:     Head: Normocephalic.     Mouth/Throat:     Mouth: Mucous membranes are moist.     Pharynx: Oropharynx is clear.  Eyes:     Conjunctiva/sclera: Conjunctivae normal.  Cardiovascular:     Rate and Rhythm: Normal rate.  Pulmonary:     Effort: Pulmonary effort is normal.     Breath sounds: Rales present.  Musculoskeletal:        General: Normal range of motion.     Cervical back: Normal range of motion and neck supple.  Skin:    General: Skin is warm.  Neurological:  General: No focal deficit present.      Mental Status: She is alert and oriented to person, place, and time.  Psychiatric:        Mood and Affect: Mood normal.        Behavior: Behavior normal.      UC Treatments / Results  Labs (all labs ordered are listed, but only abnormal results are displayed) Labs Reviewed - No data to display  EKG   Radiology Preliminary chest x-ray reading shows no infiltrate Procedures Procedures (including critical care time)  Medications Ordered in UC Medications - No data to display  Initial Impression / Assessment and Plan / UC Course  I have reviewed the triage vital signs and the nursing notes.  Pertinent labs & imaging results that were available during my care of the patient were reviewed by me and considered in my medical decision making (see chart for details).    Final Clinical Impressions(s) / UC Diagnoses   Final diagnoses:  [id number]     Discharge Instructions     Stay out of work until Monday    ED Prescriptions    None     I have reviewed the PDMP during this encounter.   Robyn Haber, MD 07/02/20 [phone]

## 2020-07-02 NOTE — ED Triage Notes (Addendum)
SX started 10 days ago with runny nose, cough started 6 days ago and breathing is labored. Congested, Mucinex and Sudafed

## 2020-07-02 NOTE — Discharge Instructions (Addendum)
Stay out of work until Monday

## 2020-07-05 MED FILL — DEXAMETHASONE 4 MG TABLET: 4 | 3 days supply | Qty: 6 | Fill #0

## 2020-07-12 ENCOUNTER — Ambulatory Visit (INDEPENDENT_AMBULATORY_CARE_PROVIDER_SITE_OTHER): Payer: No Typology Code available for payment source | Admitting: Gastroenterology

## 2020-07-12 ENCOUNTER — Encounter: Payer: Self-pay | Admitting: Gastroenterology

## 2020-07-12 VITALS — BP 134/86 | HR 84 | Ht 67.0 in | Wt 197.4 lb

## 2020-07-12 DIAGNOSIS — K50919 Crohn's disease, unspecified, with unspecified complications: Secondary | ICD-10-CM

## 2020-07-12 NOTE — Progress Notes (Signed)
Chief Complaint: To get established  Referring Provider:  Physicians, [place];   #1. 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  Rectal involvement but no perianal Crohn's. On Humira since 04/2020.  No obstructive symptoms.  #2. IDA d/t Crohn's and menorrhagia d/t uterine fibroids  Plan: -Continue Humira 40mg  SQ Q2 weekly. -FU in 6 months. At FU, check CBC, CMP, CRP, Sed rate, fecal calprotectin, humira trough level and antibodies (ADA/ATA).  -If heavy menstrual cycles, seek GYN consultation. -Get in touch with [facility] family physicians for DEXA scan.     HPI:    Amy Thomas is a 40 y.o. 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  No further constipation.  No melena or hematochezia.  No nausea/vomiting.  Of note that she never had diarrhea.   She would like to hold off on Imuran-read side effects  Pleased with the progress.  Her most recent CRP, Sed rate was normal 05/2020.  Most recent hemoglobin was normal.  He had had Covid vaccines, flu vaccine    From previous notes:  -dx ileoceal Crohns 2017 on colon (Dr Ferdinand Lango), req steroids and antibiotics and lialda. Has had intermittent symptoms since age 34 (when she was pregnant), then did good for 2 yrs thereafter. At age [age], adm to [facility] with PSBO. Treated with IV steroids.  She did not want to take Biologics at that time since she did not have any insurance.  She continued on Lialda. -Normally has some constipation with pellet like stools (rare use of enema). Never had diarrhea, melena or hematochezia even during exacerbation.  -Typical flare - RLQ pain with nausea- better with phenergan.    Previous GI procedures: Colonoscopy 03/10/2020 - Crohn's disease involving cecum and rectum. Moderate activity. - Tight cecal stricture at the level of ileocecal valve d/t active Crohn's disease. -  Terminal ileal stricture (TI could not be intubated) - Mild sigmoid diverticulosis.  CTE: 03/17/2020 Active Crohn disease involving the terminal ileum. No evidence of fistula, abscess, or bowel obstruction. 4.5 cm posterior uterine fibroid and IUD. Past Medical History:  Diagnosis Date  . Anxiety   . Blood transfusion without reported diagnosis   . Chronic headaches   . Colon polyp   . Crohn's disease ([place])   . Depression   . GERD (gastroesophageal reflux disease)   . Heart murmur   . Hypertension   . Migraine   . Neck pain     Past Surgical History:  Procedure Laterality Date  . CERVICAL BIOPSY  W/ LOOP ELECTRODE EXCISION     to remove precancous cells   . COLONOSCOPY  2017   [facility]  . LYMPHADENECTOMY Left   . TONSILLECTOMY AND ADENOIDECTOMY      Family History  Problem Relation Age of Onset  . Ovarian cancer Mother   . Migraines Mother   . Uterine cancer Maternal Grandmother   . Heart disease Maternal Grandmother   . Diabetes Maternal Grandmother   . Stomach cancer Maternal Uncle   . Other Father        no medical history of father  . Colon cancer Neg Hx   . Esophageal cancer Neg Hx     Social History   Tobacco Use  . Smoking status: Former Smoker    Quit date: 03/10/2014    Years since quitting: 6.3  . Smokeless  tobacco: Never Used  Vaping Use  . Vaping Use: Never used  Substance Use Topics  . Alcohol use: Yes    Comment: occasionally  . Drug use: Never    Current Outpatient Medications  Medication Sig Dispense Refill  . Adalimumab (HUMIRA PEN) 40 MG/0.4ML PNKT Inject 40 mg into the skin every 14 (fourteen) days. 2 each 6  . Ascorbic Acid (VITAMIN C PO) Take 1 tablet by mouth daily. Chewable tablet    . dexamethasone (DECADRON) 4 MG tablet Take 1 tablet (4 mg total) by mouth 2 (two) times daily with a meal. 6 tablet 0  . Erenumab-aooe (AIMOVIG) 70 MG/ML SOAJ Inject 70 mg into the skin every 30 (thirty) days. 1.12 mL 11  . Ferrous  Sulfate (IRON SUPPLEMENT PO) Take 2 tablets by mouth daily.    . hydrOXYzine (VISTARIL) 25 MG capsule Take 25-50 mg by mouth as needed.     Marland Kitchen levonorgestrel (MIRENA) 20 MCG/24HR IUD 1 each by Intrauterine route once.    Marland Kitchen losartan (COZAAR) 50 MG tablet Take 50 mg by mouth daily.    . ondansetron (ZOFRAN ODT) 4 MG disintegrating tablet Take 1 tablet (4 mg total) by mouth every 8 (eight) hours as needed. 20 tablet 6  . rizatriptan (MAXALT-MLT) 10 MG disintegrating tablet Take 1 tablet (10 mg total) by mouth as needed. May repeat in 2 hours if needed 15 tablet 6  . tiZANidine (ZANAFLEX) 4 MG tablet Take 1 tablet (4 mg total) by mouth every 6 (six) hours as needed for muscle spasms. 30 tablet 6  . venlafaxine XR (EFFEXOR XR) 37.5 MG 24 hr capsule Take 1 capsule (37.5 mg total) by mouth daily with breakfast. 30 capsule 11   No current facility-administered medications for this visit.    Allergies  Allergen Reactions  . Cefaclor Shortness Of Breath    Review of Systems:  neg     Physical Exam:    BP 134/86   Pulse 84   Ht 5\' 7"  (1.702 m)   Wt 197 lb 6 oz (89.5 kg)   BMI 30.91 kg/m  Wt Readings from Last 3 Encounters:  07/12/20 197 lb 6 oz (89.5 kg)  07/02/20 198 lb (89.8 kg)  06/26/20 200 lb (90.7 kg)   Constitutional:  Well-developed, in no acute distress. Psychiatric: Normal mood and affect. Behavior is normal. HEENT: Pupils normal.  Conjunctivae are normal. No scleral icterus. Cardiovascular: Normal rate, regular rhythm. No edema Pulmonary/chest: Effort normal and breath sounds normal. No wheezing, rales or rhonchi. Abdominal: Soft, nondistended. Nontender. Bowel sounds active throughout. There are no masses palpable. No hepatomegaly. Neurological: Alert and oriented to person place and time. Skin: Skin is warm and dry. No rashes noted.  Data Reviewed: I have personally reviewed following labs and imaging studies    06/28/20, MD 07/12/2020, 9:32 AM  Cc: Physicians, 09/09/2020 F*

## 2020-07-12 NOTE — Patient Instructions (Signed)
If you are age 41 or older, your body mass index should be between 23-30. Your Body mass index is 30.91 kg/m. If this is out of the aforementioned range listed, please consider follow up with your Primary Care Provider.  If you are age 10 or younger, your body mass index should be between 19-25. Your Body mass index is 30.91 kg/m. If this is out of the aformentioned range listed, please consider follow up with your Primary Care Provider.    Due to recent changes in healthcare laws, you may see the results of your imaging and laboratory studies on MyChart before your provider has had a chance to review them.  We understand that in some cases there may be results that are confusing or concerning to you. Not all laboratory results come back in the same time frame and the provider may be waiting for multiple results in order to interpret others.  Please give [place] 48 hours in order for your provider to thoroughly review all the results before contacting the office for clarification of your results.   It was a pleasure to see you today!  Lynann Bologna, M.D.  Follow up in 6 months, please call to schedule this appointment at [phone]

## 2020-07-15 MED FILL — VENLAFAXINE HCL ER 37.5 MG: 37.5 | 30 days supply | Qty: 30 | Fill #1

## 2020-07-16 MED FILL — AIMOVIG 70 MG/ML SOAJ: 70 | 30 days supply | Qty: 1 | Fill #0

## 2020-07-16 MED FILL — tiZANidine HCL 4 MG TABS: 4 | 7 days supply | Qty: 30 | Fill #1

## 2020-07-20 ENCOUNTER — Other Ambulatory Visit: Payer: Self-pay | Admitting: Gastroenterology

## 2020-07-20 DIAGNOSIS — K50919 Crohn's disease, unspecified, with unspecified complications: Secondary | ICD-10-CM

## 2020-07-23 MED FILL — RIZATRIPTAN 10 MG ODT: 10 | 30 days supply | Qty: 15 | Fill #1

## 2020-07-29 MED FILL — HUMIRA PEN 40 MG/0.4ML PNKT: 40 | 28 days supply | Qty: 2 | Fill #3

## 2020-08-05 ENCOUNTER — Ambulatory Visit
Admission: RE | Admit: 2020-08-05 | Discharge: 2020-08-05 | Disposition: A | Discharge status: Home / Self Care | Payer: No Typology Code available for payment source | Source: Ambulatory Visit | Attending: Emergency Medicine | Admitting: Emergency Medicine

## 2020-08-05 DIAGNOSIS — D171 Benign lipomatous neoplasm of skin and subcutaneous tissue of trunk: Secondary | ICD-10-CM

## 2020-08-20 MED FILL — RIZATRIPTAN 10 MG ODT: 10 | 30 days supply | Qty: 15 | Fill #2

## 2020-08-20 MED FILL — tiZANidine HCL 4 MG TABS: 4 | 7 days supply | Qty: 30 | Fill #2

## 2020-08-20 MED FILL — AIMOVIG 70 MG/ML SOAJ: 70 | 30 days supply | Qty: 1 | Fill #1

## 2020-08-20 MED FILL — PROMETHAZINE 12.5 MG TABLET: 12.5 | 30 days supply | Qty: 180 | Fill #1

## 2020-08-20 MED FILL — LOSARTAN POTASSIUM 50 MG TA: 50 | 30 days supply | Qty: 30 | Fill #1

## 2020-08-23 ENCOUNTER — Other Ambulatory Visit: Payer: Self-pay | Admitting: Neurology

## 2020-08-23 MED ORDER — VENLAFAXINE HCL ER 37.5 MG PO CP24: 75.0000 mg | ORAL_CAPSULE | Freq: Every day | ORAL | 4 refills | Status: DC

## 2020-08-23 MED ORDER — SUMATRIPTAN SUCCINATE 6 MG/0.5ML ~~LOC~~ SOLN: 6.0000 mg | SUBCUTANEOUS | 6 refills | Status: DC | PRN

## 2020-08-23 MED FILL — VENLAFAXINE HCL ER 37.5 MG: 37.5 | 45 days supply | Qty: 90 | Fill #0

## 2020-08-23 MED FILL — SUMATRIPTAN 6 MG/0.5 ML VIA: 6 | 28 days supply | Qty: 3 | Fill #0

## 2020-08-23 NOTE — Telephone Encounter (Signed)
For abortive treatment  1.  I have written Imitrex subcutaneous injection for severe prolonged migraine headaches, she may combine it with Zofran, Aleve, tizanidine, if she has difficulty sleeping, may add on Benadryl 25 mg at nighttime into cocktail,  2.  Moderate headache, she can continue to use Maxalt as needed  For preventive medication 1.  I have increased Effexor XR from 37.5 to 75 mg every day 2.  If she thinks aimovig 70 mg every months was very helpful, may increase to 140 mg every months

## 2020-08-30 MED FILL — HUMIRA PEN 40 MG/0.4ML PNKT: 40 | 28 days supply | Qty: 2 | Fill #4

## 2020-09-02 ENCOUNTER — Ambulatory Visit: Payer: No Typology Code available for payment source | Admitting: Neurology

## 2020-09-29 MED FILL — tiZANidine HCL 4 MG TABS: 4 | 7 days supply | Qty: 30 | Fill #3

## 2020-09-29 MED FILL — RIZATRIPTAN 10 MG ODT: 10 | 30 days supply | Qty: 15 | Fill #0

## 2020-09-30 ENCOUNTER — Other Ambulatory Visit (HOSPITAL_COMMUNITY): Payer: Self-pay | Admitting: Registered Nurse

## 2020-09-30 ENCOUNTER — Other Ambulatory Visit (HOSPITAL_BASED_OUTPATIENT_CLINIC_OR_DEPARTMENT_OTHER): Payer: Self-pay | Admitting: Registered Nurse

## 2020-09-30 MED FILL — LOSARTAN POTASSIUM 50 MG TA: 50 | 90 days supply | Qty: 90 | Fill #0

## 2020-10-01 LAB — HM PAP SMEAR: HM Pap smear: NEGATIVE

## 2020-10-08 ENCOUNTER — Other Ambulatory Visit (HOSPITAL_COMMUNITY): Payer: Self-pay

## 2020-10-11 ENCOUNTER — Ambulatory Visit: Payer: No Typology Code available for payment source | Admitting: Neurology

## 2020-10-13 ENCOUNTER — Other Ambulatory Visit (HOSPITAL_COMMUNITY): Payer: Self-pay

## 2020-10-13 MED FILL — Adalimumab Auto-injector Kit 40 MG/0.4ML: SUBCUTANEOUS | 28 days supply | Qty: 2 | Fill #0 | Status: AC

## 2020-11-02 ENCOUNTER — Other Ambulatory Visit (HOSPITAL_COMMUNITY): Payer: Self-pay

## 2020-11-02 MED FILL — Adalimumab Auto-injector Kit 40 MG/0.4ML: SUBCUTANEOUS | 28 days supply | Qty: 2 | Fill #1 | Status: AC

## 2020-11-05 ENCOUNTER — Other Ambulatory Visit (HOSPITAL_COMMUNITY): Payer: Self-pay

## 2020-11-08 ENCOUNTER — Other Ambulatory Visit (HOSPITAL_COMMUNITY): Payer: Self-pay

## 2020-11-08 MED ORDER — OXYCODONE HCL 5 MG PO TABS
5.0000 mg | ORAL_TABLET | ORAL | 0 refills | Status: DC | PRN
  Filled 2020-11-08 – 2021-03-28 (×2): qty 10, 2d supply, fill #0

## 2020-11-16 ENCOUNTER — Other Ambulatory Visit (HOSPITAL_COMMUNITY): Payer: Self-pay

## 2020-11-24 ENCOUNTER — Other Ambulatory Visit (HOSPITAL_BASED_OUTPATIENT_CLINIC_OR_DEPARTMENT_OTHER): Payer: Self-pay

## 2020-11-24 MED FILL — Rizatriptan Benzoate Oral Disintegrating Tab 10 MG (Base Eq): ORAL | 30 days supply | Qty: 15 | Fill #0 | Status: AC

## 2020-12-03 ENCOUNTER — Other Ambulatory Visit (HOSPITAL_COMMUNITY): Payer: Self-pay

## 2020-12-03 ENCOUNTER — Other Ambulatory Visit: Payer: Self-pay | Admitting: Gastroenterology

## 2020-12-07 ENCOUNTER — Other Ambulatory Visit (HOSPITAL_COMMUNITY): Payer: Self-pay

## 2020-12-07 ENCOUNTER — Other Ambulatory Visit: Payer: Self-pay | Admitting: Gastroenterology

## 2020-12-07 ENCOUNTER — Other Ambulatory Visit: Payer: Self-pay

## 2020-12-08 ENCOUNTER — Other Ambulatory Visit: Payer: Self-pay | Admitting: Gastroenterology

## 2020-12-08 ENCOUNTER — Other Ambulatory Visit (HOSPITAL_COMMUNITY): Payer: Self-pay

## 2020-12-10 ENCOUNTER — Other Ambulatory Visit (HOSPITAL_COMMUNITY): Payer: Self-pay

## 2020-12-10 ENCOUNTER — Other Ambulatory Visit: Payer: Self-pay | Admitting: Pharmacist

## 2020-12-10 MED ORDER — HUMIRA (2 PEN) 40 MG/0.4ML ~~LOC~~ AJKT
40.0000 mg | AUTO-INJECTOR | SUBCUTANEOUS | 6 refills | Status: DC
  Filled 2020-12-10: qty 2, 28d supply, fill #0

## 2020-12-10 MED ORDER — HUMIRA (2 PEN) 40 MG/0.4ML ~~LOC~~ AJKT
40.0000 mg | AUTO-INJECTOR | SUBCUTANEOUS | 6 refills | Status: DC
  Filled 2020-12-10: qty 2, 28d supply, fill #0
  Filled 2021-02-16: qty 2, 28d supply, fill #1
  Filled 2021-03-15: qty 2, 28d supply, fill #2
  Filled 2021-03-28 – 2021-04-07 (×2): qty 2, 28d supply, fill #3
  Filled 2021-05-02: qty 2, 28d supply, fill #4
  Filled 2021-06-01: qty 2, 28d supply, fill #5
  Filled 2021-06-27: qty 2, 28d supply, fill #6

## 2021-01-03 ENCOUNTER — Other Ambulatory Visit (HOSPITAL_COMMUNITY): Payer: Self-pay

## 2021-01-03 MED ORDER — CARESTART COVID-19 HOME TEST VI KIT
PACK | 0 refills | Status: DC
  Filled 2021-01-03: qty 2, 2d supply, fill #0

## 2021-01-06 ENCOUNTER — Telehealth: Payer: No Typology Code available for payment source | Admitting: Physician Assistant

## 2021-01-06 DIAGNOSIS — B9689 Other specified bacterial agents as the cause of diseases classified elsewhere: Secondary | ICD-10-CM | POA: Diagnosis not present

## 2021-01-06 DIAGNOSIS — N760 Acute vaginitis: Secondary | ICD-10-CM

## 2021-01-06 MED ORDER — METRONIDAZOLE 500 MG PO TABS: 500.0000 mg | ORAL_TABLET | Freq: Two times a day (BID) | ORAL | 0 refills | Status: DC

## 2021-01-06 NOTE — Progress Notes (Signed)
I have spent 5 minutes in review of e-visit questionnaire, review and updating patient chart, medical decision making and response to patient.   Ahlia Lemanski Cody Elona Yinger, PA-C    

## 2021-01-06 NOTE — Progress Notes (Signed)

## 2021-01-11 ENCOUNTER — Other Ambulatory Visit (HOSPITAL_COMMUNITY): Payer: Self-pay

## 2021-01-14 ENCOUNTER — Other Ambulatory Visit (HOSPITAL_COMMUNITY): Payer: Self-pay

## 2021-01-14 ENCOUNTER — Other Ambulatory Visit (HOSPITAL_BASED_OUTPATIENT_CLINIC_OR_DEPARTMENT_OTHER): Payer: Self-pay

## 2021-01-14 MED FILL — Rizatriptan Benzoate Oral Disintegrating Tab 10 MG (Base Eq): ORAL | 30 days supply | Qty: 15 | Fill #1 | Status: AC

## 2021-01-14 MED FILL — Losartan Potassium Tab 50 MG: ORAL | 30 days supply | Qty: 30 | Fill #0 | Status: AC

## 2021-01-14 MED FILL — Tizanidine HCl Tab 4 MG (Base Equivalent): ORAL | 8 days supply | Qty: 30 | Fill #0 | Status: AC

## 2021-02-16 ENCOUNTER — Telehealth: Payer: No Typology Code available for payment source | Admitting: Physician Assistant

## 2021-02-16 ENCOUNTER — Other Ambulatory Visit (HOSPITAL_COMMUNITY): Payer: Self-pay

## 2021-02-16 DIAGNOSIS — R3989 Other symptoms and signs involving the genitourinary system: Secondary | ICD-10-CM

## 2021-02-16 MED ORDER — NITROFURANTOIN MONOHYD MACRO 100 MG PO CAPS: 100.0000 mg | ORAL_CAPSULE | Freq: Two times a day (BID) | ORAL | 0 refills | Status: DC

## 2021-02-16 NOTE — Progress Notes (Signed)
E-Visit for Urinary Problems  We are sorry that you are not feeling well.  Here is how we plan to help!  Based on what you shared with me it looks like you most likely have a simple urinary tract infection.  A UTI (Urinary Tract Infection) is a bacterial infection of the bladder.  Most cases of urinary tract infections are simple to treat but a key part of your care is to encourage you to drink plenty of fluids and watch your symptoms carefully.  I have prescribed MacroBid 100 mg twice a day for 5 days.  Your symptoms should gradually improve. Call us if the burning in your urine worsens, you develop worsening fever, back pain or pelvic pain or if your symptoms do not resolve after completing the antibiotic.  Urinary tract infections can be prevented by drinking plenty of water to keep your body hydrated.  Also be sure when you wipe, wipe from front to back and don't hold it in!  If possible, empty your bladder every 4 hours.  HOME CARE Drink plenty of fluids Compete the full course of the antibiotics even if the symptoms resolve Remember, when you need to go.go. Holding in your urine can increase the likelihood of getting a UTI! GET HELP RIGHT AWAY IF: You cannot urinate You get a high fever Worsening back pain occurs You see blood in your urine You feel sick to your stomach or throw up You feel like you are going to pass out  MAKE SURE YOU  Understand these instructions. Will watch your condition. Will get help right away if you are not doing well or get worse.   Thank you for choosing an e-visit.  Your e-visit answers were reviewed by a board certified advanced clinical practitioner to complete your personal care plan. Depending upon the condition, your plan could have included both over the counter or prescription medications.  Please review your pharmacy choice. Make sure the pharmacy is open so you can pick up prescription now. If there is a problem, you may contact your  provider through MyChart messaging and have the prescription routed to another pharmacy.  Your safety is important to us. If you have drug allergies check your prescription carefully.   For the next 24 hours you can use MyChart to ask questions about today's visit, request a non-urgent call back, or ask for a work or school excuse. You will get an email in the next two days asking about your experience. I hope that your e-visit has been valuable and will speed your recovery.  I provided 6 minutes of non face-to-face time during this encounter for chart review and documentation.   

## 2021-02-21 ENCOUNTER — Other Ambulatory Visit (HOSPITAL_BASED_OUTPATIENT_CLINIC_OR_DEPARTMENT_OTHER): Payer: Self-pay

## 2021-02-21 MED FILL — Rizatriptan Benzoate Oral Disintegrating Tab 10 MG (Base Eq): ORAL | 30 days supply | Qty: 15 | Fill #2 | Status: AC

## 2021-02-21 MED FILL — Losartan Potassium Tab 50 MG: ORAL | 30 days supply | Qty: 30 | Fill #1 | Status: AC

## 2021-03-09 ENCOUNTER — Other Ambulatory Visit (HOSPITAL_COMMUNITY): Payer: Self-pay

## 2021-03-14 ENCOUNTER — Telehealth: Payer: No Typology Code available for payment source | Admitting: Physician Assistant

## 2021-03-14 DIAGNOSIS — B9689 Other specified bacterial agents as the cause of diseases classified elsewhere: Secondary | ICD-10-CM | POA: Diagnosis not present

## 2021-03-14 DIAGNOSIS — N760 Acute vaginitis: Secondary | ICD-10-CM

## 2021-03-15 ENCOUNTER — Other Ambulatory Visit (HOSPITAL_COMMUNITY): Payer: Self-pay

## 2021-03-15 MED ORDER — METRONIDAZOLE 500 MG PO TABS: 500.0000 mg | ORAL_TABLET | Freq: Two times a day (BID) | ORAL | 0 refills | Status: DC

## 2021-03-15 NOTE — Progress Notes (Signed)
I have spent 5 minutes in review of e-visit questionnaire, review and updating patient chart, medical decision making and response to patient.   Tessah Patchen Cody Jehad Bisono, PA-C    

## 2021-03-15 NOTE — Progress Notes (Signed)

## 2021-03-19 ENCOUNTER — Other Ambulatory Visit: Payer: Self-pay | Admitting: Gastroenterology

## 2021-03-19 DIAGNOSIS — K50919 Crohn's disease, unspecified, with unspecified complications: Secondary | ICD-10-CM

## 2021-03-28 ENCOUNTER — Telehealth: Payer: No Typology Code available for payment source | Admitting: Physician Assistant

## 2021-03-28 ENCOUNTER — Other Ambulatory Visit (HOSPITAL_BASED_OUTPATIENT_CLINIC_OR_DEPARTMENT_OTHER): Payer: Self-pay

## 2021-03-28 ENCOUNTER — Other Ambulatory Visit (HOSPITAL_COMMUNITY): Payer: Self-pay

## 2021-03-28 DIAGNOSIS — M7552 Bursitis of left shoulder: Secondary | ICD-10-CM

## 2021-03-28 DIAGNOSIS — M25512 Pain in left shoulder: Secondary | ICD-10-CM

## 2021-03-28 MED ORDER — LOSARTAN POTASSIUM 50 MG PO TABS
50.0000 mg | ORAL_TABLET | Freq: Every day | ORAL | 3 refills | Status: DC
  Filled 2021-03-28: qty 30, 30d supply, fill #0

## 2021-03-28 MED ORDER — METHYLPREDNISOLONE 4 MG PO TBPK
ORAL_TABLET | ORAL | 0 refills | Status: DC
  Filled 2021-03-28: qty 21, 6d supply, fill #0

## 2021-03-28 MED FILL — Losartan Potassium Tab 50 MG: ORAL | 30 days supply | Qty: 30 | Fill #0 | Status: AC

## 2021-03-28 MED FILL — Sumatriptan Succinate Inj 6 MG/0.5ML: SUBCUTANEOUS | 15 days supply | Qty: 2.5 | Fill #0 | Status: AC

## 2021-03-28 MED FILL — Tizanidine HCl Tab 4 MG (Base Equivalent): ORAL | 8 days supply | Qty: 30 | Fill #1 | Status: AC

## 2021-03-28 NOTE — Progress Notes (Signed)
Virtual Visit Consent   Amy Thomas, you are scheduled for a virtual visit with a [facility] provider today.     Just as with appointments in the office, your consent must be obtained to participate.  Your consent will be active for this visit and any virtual visit you may have with one of our providers in the next 365 days.     If you have a MyChart account, a copy of this consent can be sent to you electronically.  All virtual visits are billed to your insurance company just like a traditional visit in the office.    As this is a virtual visit, video technology does not allow for your provider to perform a traditional examination.  This may limit your provider's ability to fully assess your condition.  If your provider identifies any concerns that need to be evaluated in person or the need to arrange testing (such as labs, EKG, etc.), we will make arrangements to do so.     Although advances in technology are sophisticated, we cannot ensure that it will always work on either your end or our end.  If the connection with a video visit is poor, the visit may have to be switched to a telephone visit.  With either a video or telephone visit, we are not always able to ensure that we have a secure connection.     I need to obtain your verbal consent now.   Are you willing to proceed with your visit today?    Amy Thomas has provided verbal consent on 03/28/2021 for a virtual visit (video or telephone).   Mar Daring, PA-C   Date: 03/28/2021 10:03 AM   Virtual Visit via Video Note   I, Mar Daring, connected with  Amy Thomas  ([id number], March 13, 1981) on 03/28/21 at  9:45 AM EDT by a video-enabled telemedicine application and verified that I am speaking with the correct person using two identifiers.  Location: Patient: Work; Contractor: Scientist, research (medical) Provider: Home Office   I discussed the limitations of evaluation and management  by telemedicine and the availability of in person appointments. The patient expressed understanding and agreed to proceed.    History of Present Illness: Amy Thomas is a 40 y.o. who identifies as a female who was assigned female at birth, and is being seen today for shoulder pain. Symptoms started a few weeks ago. She reports the pain is on the top of the left shoulder over the [facility] joint. 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 She does not remember a specific injury. She has been taking motrin and tylenol without much relief. She reports rest, on days off, the pain does ease. However, when starting back to work it becomes aggravating again.   Problems:  Patient Active Problem List   Diagnosis Date Noted   Chronic migraine w/o aura w/o status migrainosus, not intractable 06/17/2020   Neck pain 06/17/2020    Allergies:  Allergies  Allergen Reactions   Cefaclor Shortness Of Breath   Medications:  Current Outpatient Medications:    methylPREDNISolone (MEDROL DOSEPAK) 4 MG TBPK tablet, 6 day taper; take as directed on package instructions, Disp: 21 tablet, Rfl: 0   Adalimumab (HUMIRA PEN) 40 MG/0.4ML PNKT, INJECT 40 MG INTO THE SKIN EVERY 14 (FOURTEEN) DAYS., Disp: 2 each, Rfl: 6   COVID-19 At Home Antigen Test (CARESTART COVID-19 HOME TEST) KIT, Use as directed, Disp: 2  each, Rfl: 0   Erenumab-aooe 70 MG/ML SOAJ, INJECT 70 MG INTO THE SKIN EVERY 30 (THIRTY) DAYS., Disp: 1 mL, Rfl: 11   Ferrous Sulfate (IRON SUPPLEMENT PO), Take 2 tablets by mouth daily., Disp: , Rfl:    FLUoxetine (PROZAC) 10 MG capsule, TAKE 1 CAPSULE BY MOUTH ONCE DAILY, Disp: 30 capsule, Rfl: 2   levonorgestrel (MIRENA) 20 MCG/24HR IUD, 1 each by Intrauterine route once., Disp: , Rfl:    losartan (COZAAR) 50 MG tablet, Take 50 mg by mouth daily., Disp: , Rfl:    losartan (COZAAR) 50 MG tablet, TAKE 1 TABLET BY MOUTH ONCE DAILY, Disp: 30 tablet, Rfl: 3   losartan (COZAAR) 50 MG  tablet, TAKE 1 TABLET BY MOUTH ONCE DAILY, Disp: 30 tablet, Rfl: 3   losartan (COZAAR) 50 MG tablet, TAKE 1 TABLET BY MOUTH ONCE DAILY, Disp: 30 tablet, Rfl: 3   metroNIDAZOLE (FLAGYL) 500 MG tablet, Take 1 tablet (500 mg total) by mouth 2 (two) times daily., Disp: 14 tablet, Rfl: 0   ondansetron (ZOFRAN-ODT) 4 MG disintegrating tablet, TAKE 1 TABLET (4 MG TOTAL) BY MOUTH EVERY 8 (EIGHT) HOURS AS NEEDED., Disp: 20 tablet, Rfl: 6   oxyCODONE (OXY IR/ROXICODONE) 5 MG immediate release tablet, Take 1 tablet (5 mg total) by mouth every 4 (four) hours as needed for pain, Disp: 10 tablet, Rfl: 0   promethazine (PHENERGAN) 12.5 MG tablet, TAKE 1 TO 2 TABLETS BY MOUTH EVERY 8 HOURS AS NEEDED FOR NAUSEA, Disp: 180 tablet, Rfl: 1   rizatriptan (MAXALT-MLT) 10 MG disintegrating tablet, TAKE 1 TABLET (10 MG TOTAL) BY MOUTH AS NEEDED. MAY REPEAT IN 2 HOURS IF NEEDED, Disp: 15 tablet, Rfl: 3   SUMAtriptan (IMITREX) 6 MG/0.5ML SOLN injection, INJECT 0.5MLS INTO THE SKIN EVERY 2 HOURS AS NEEDED FOR MIGRAINE OR HEADACHES. MAY REPEAT IN 2 HOURS IF HEADACHE PERSISTS OR RECURS, Disp: 2.5 mL, Rfl: 6   tiZANidine (ZANAFLEX) 4 MG tablet, TAKE 1 TABLET (4 MG TOTAL) BY MOUTH EVERY 6 (SIX) HOURS AS NEEDED FOR MUSCLE SPASMS., Disp: 30 tablet, Rfl: 6   venlafaxine XR (EFFEXOR-XR) 37.5 MG 24 hr capsule, TAKE 2 CAPSULES BY MOUTH DAILY WITH BREAKFAST, Disp: 90 capsule, Rfl: 4  Observations/Objective: Patient is well-developed, well-nourished in no acute distress.  Resting comfortably at work, isolated.  Head is normocephalic, atraumatic.  No labored breathing. Speech is clear and coherent with logical content.  Patient is alert and oriented at baseline.  TTP over left AC joint per patient without noticeable deformity  Assessment and Plan: 1. Arthralgia of left acromioclavicular joint - methylPREDNISolone (MEDROL DOSEPAK) 4 MG TBPK tablet; 6 day taper; take as directed on package instructions  Dispense: 21 tablet; Refill:  0  2. Acute shoulder bursitis, left - methylPREDNISolone (MEDROL DOSEPAK) 4 MG TBPK tablet; 6 day taper; take as directed on package instructions  Dispense: 21 tablet; Refill: 0  - Suspect bursitis and possible AC joint sprain - Will add medrol, may continue tylenol - Advised could use icy hot, bengay, biofreeze, or even diclofenac gel topically over [facility] joint - Exercises will be provided via AVS - Advised will need orthopedic follow up if pain does not improve with oral medications.   Follow Up Instructions: I discussed the assessment and treatment plan with the patient. The patient was provided an opportunity to ask questions and all were answered. The patient agreed with the plan and demonstrated an understanding of the instructions.  A copy of instructions were sent to the patient via  MyChart.  The patient was advised to call back or seek an in-person evaluation if the symptoms worsen or if the condition fails to improve as anticipated.  Time:  I spent 10 minutes with the patient via telehealth technology discussing the above problems/concerns.    Mar Daring, PA-C

## 2021-03-28 NOTE — Patient Instructions (Addendum)
Amy Thomas, thank you for joining Mar Daring, PA-C for today's virtual visit.  While this provider is not your primary care provider (PCP), if your PCP is located in our provider database this encounter information will be shared with them immediately following your visit.  Consent: (Patient) Amy Thomas provided verbal consent for this virtual visit at the beginning of the encounter.  Current Medications:  Current Outpatient Medications:    methylPREDNISolone (MEDROL DOSEPAK) 4 MG TBPK tablet, take by mouth as directed on package instructions, Disp: 21 tablet, Rfl: 0   Adalimumab (HUMIRA PEN) 40 MG/0.4ML PNKT, INJECT 40 MG INTO THE SKIN EVERY 14 (FOURTEEN) DAYS., Disp: 2 each, Rfl: 6   COVID-19 At Home Antigen Test (CARESTART COVID-19 HOME TEST) KIT, Use as directed, Disp: 2 each, Rfl: 0   Erenumab-aooe 70 MG/ML SOAJ, INJECT 70 MG INTO THE SKIN EVERY 30 (THIRTY) DAYS., Disp: 1 mL, Rfl: 11   Ferrous Sulfate (IRON SUPPLEMENT PO), Take 2 tablets by mouth daily., Disp: , Rfl:    FLUoxetine (PROZAC) 10 MG capsule, TAKE 1 CAPSULE BY MOUTH ONCE DAILY, Disp: 30 capsule, Rfl: 2   levonorgestrel (MIRENA) 20 MCG/24HR IUD, 1 each by Intrauterine route once., Disp: , Rfl:    losartan (COZAAR) 50 MG tablet, Take 50 mg by mouth daily., Disp: , Rfl:    losartan (COZAAR) 50 MG tablet, TAKE 1 TABLET BY MOUTH ONCE DAILY, Disp: 30 tablet, Rfl: 3   losartan (COZAAR) 50 MG tablet, TAKE 1 TABLET BY MOUTH ONCE DAILY, Disp: 30 tablet, Rfl: 3   losartan (COZAAR) 50 MG tablet, TAKE 1 TABLET BY MOUTH ONCE DAILY, Disp: 30 tablet, Rfl: 3   metroNIDAZOLE (FLAGYL) 500 MG tablet, Take 1 tablet (500 mg total) by mouth 2 (two) times daily., Disp: 14 tablet, Rfl: 0   ondansetron (ZOFRAN-ODT) 4 MG disintegrating tablet, TAKE 1 TABLET (4 MG TOTAL) BY MOUTH EVERY 8 (EIGHT) HOURS AS NEEDED., Disp: 20 tablet, Rfl: 6   oxyCODONE (OXY IR/ROXICODONE) 5 MG immediate release tablet, Take 1 tablet (5 mg  total) by mouth every 4 (four) hours as needed for pain, Disp: 10 tablet, Rfl: 0   promethazine (PHENERGAN) 12.5 MG tablet, TAKE 1 TO 2 TABLETS BY MOUTH EVERY 8 HOURS AS NEEDED FOR NAUSEA, Disp: 180 tablet, Rfl: 1   rizatriptan (MAXALT-MLT) 10 MG disintegrating tablet, TAKE 1 TABLET (10 MG TOTAL) BY MOUTH AS NEEDED. MAY REPEAT IN 2 HOURS IF NEEDED, Disp: 15 tablet, Rfl: 3   SUMAtriptan (IMITREX) 6 MG/0.5ML SOLN injection, INJECT 0.5MLS INTO THE SKIN EVERY 2 HOURS AS NEEDED FOR MIGRAINE OR HEADACHES. MAY REPEAT IN 2 HOURS IF HEADACHE PERSISTS OR RECURS, Disp: 2.5 mL, Rfl: 6   tiZANidine (ZANAFLEX) 4 MG tablet, TAKE 1 TABLET (4 MG TOTAL) BY MOUTH EVERY 6 (SIX) HOURS AS NEEDED FOR MUSCLE SPASMS., Disp: 30 tablet, Rfl: 6   venlafaxine XR (EFFEXOR-XR) 37.5 MG 24 hr capsule, TAKE 2 CAPSULES BY MOUTH DAILY WITH BREAKFAST, Disp: 90 capsule, Rfl: 4   Medications ordered in this encounter:  Meds ordered this encounter  Medications   methylPREDNISolone (MEDROL DOSEPAK) 4 MG TBPK tablet    Sig: take by mouth as directed on package instructions    Dispense:  21 tablet    Refill:  0    Order Specific Question:   Supervising Provider    Answer:   Noemi Chapel [[id number]]     *If you need refills on other medications prior to your next appointment, please contact  your pharmacy*  Follow-Up: Call back or seek an in-person evaluation if the symptoms worsen or if the condition fails to improve as anticipated.  Other Instructions Shoulder Sprain A shoulder sprain is a partial or complete tear in one of the tough, fiber-like tissues (ligaments) in the shoulder. The ligaments in the shoulder help to hold the shoulder in place. What are the causes? This condition may be caused by: A fall. A hit to the shoulder. A twist of the arm. What increases the risk? You are more likely to develop this condition if you: Play sports. Have problems with balance or coordination. What are the signs or symptoms? Symptoms of  this condition include: Pain when moving the shoulder. Limited ability to move the shoulder. Swelling and tenderness on top of the shoulder. Warmth in the shoulder. A change in the shape of the shoulder. Redness or bruising on the shoulder. How is this diagnosed? This condition is diagnosed with: A physical exam. During the exam, you may be asked to do simple exercises with your shoulder. Imaging tests such as X-rays, MRI, or a CT scan. These tests can show how severe the sprain is. How is this treated? This condition may be treated with: Rest. Pain medicine. Ice. A sling or brace. This is used to keep the arm still while the shoulder is healing. Physical therapy or rehabilitation exercises. These help to improve the range of motion and strength of the shoulder. Surgery (rare). Surgery may be needed if the sprain caused a joint to become unstable. Surgery may also be needed to reduce pain. Some people may develop ongoing shoulder pain or lose some range of motion in the shoulder. However, most people do not develop long-term problems. Follow these instructions at home: If you have a sling or brace: Wear the sling or brace as told by your health care provider. Remove it only as told by your health care provider. Loosen the sling or brace if your fingers tingle, become numb, or turn cold and blue. Keep the sling or brace clean. If the sling or brace is not waterproof: Do not let it get wet. Cover it with a watertight covering when you take a bath or shower. Activity Rest your shoulder. Move your arm only as much as told by your health care provider, but move your hand and fingers often to prevent stiffness and swelling. Return to your normal activities as told by your health care provider. Ask your health care provider what activities are safe for you. Ask your health care provider when it is safe for you to drive if you have a sling or brace on your shoulder. If you were shown how to  do any exercises, do them as told by your health care provider. General instructions If directed, put ice on the affected area. Put ice in a plastic bag. Place a towel between your skin and the bag. Leave the ice on for 20 minutes, 2-3 times a day. Take over-the-counter and prescription medicines only as told by your health care provider. Do not use any products that contain nicotine or tobacco, such as cigarettes, e-cigarettes, and chewing tobacco. These can delay healing. If you need help quitting, ask your health care provider. Keep all follow-up visits as told by your health care provider. This is important. Contact a health care provider if: Your pain gets worse. Your pain is not relieved with medicines. You have increased redness or swelling. Get help right away if: You have a fever. You cannot  move your arm or shoulder. You develop severe numbness or tingling in your arm, hand, or fingers. Your arm, hand, or fingers feel cold and turn blue, white, or gray. Summary A shoulder sprain is a partial or complete tear in one of the tough, fiber-like tissues (ligaments) in the shoulder. This condition may be caused by a fall, a hit to the shoulder, or a twist of the arm. Treatment usually includes rest, ice, and pain medicine as needed. If you have a sling or brace, wear it as told by your health care provider. Remove it only as told by your health care provider. This information is not intended to replace advice given to you by your health care provider. Make sure you discuss any questions you have with your health care provider. Document Revised: 11/30/2017 Document Reviewed: 11/30/2017 Elsevier Patient Education  [employer].  Shoulder Exercises Ask your health care provider which exercises are safe for you. Do exercises exactly as told by your health care provider and adjust them as directed. It is normal to feel mild stretching, pulling, tightness, or discomfort as you do these  exercises. Stop right away if you feel sudden pain or your pain gets worse. Do not begin these exercises until told by your health care provider. Stretching exercises External rotation and abduction This exercise is sometimes called corner stretch. This exercise rotates your arm outward (external rotation) and moves your arm out from your body (abduction). Stand in a doorway with one of your feet slightly in front of the other. This is called a staggered stance. If you cannot reach your forearms to the door frame, stand facing a corner of a room. Choose one of the following positions as told by your health care provider: Place your hands and forearms on the door frame above your head. Place your hands and forearms on the door frame at the height of your head. Place your hands on the door frame at the height of your elbows. Slowly move your weight onto your front foot until you feel a stretch across your chest and in the front of your shoulders. Keep your head and chest upright and keep your abdominal muscles tight. Hold for __________ seconds. To release the stretch, shift your weight to your back foot. Repeat __________ times. Complete this exercise __________ times a day. Extension, standing Stand and hold a broomstick, a cane, or a similar object behind your back. Your hands should be a little wider than shoulder width apart. Your palms should face away from your back. Keeping your elbows straight and your shoulder muscles relaxed, move the stick away from your body until you feel a stretch in your shoulders (extension). Avoid shrugging your shoulders while you move the stick. Keep your shoulder blades tucked down toward the middle of your back. Hold for __________ seconds. Slowly return to the starting position. Repeat __________ times. Complete this exercise __________ times a day. Range-of-motion exercises Pendulum  Stand near a wall or a surface that you can hold onto for  balance. Bend at the waist and let your left / right arm hang straight down. Use your other arm to support you. Keep your back straight and do not lock your knees. Relax your left / right arm and shoulder muscles, and move your hips and your trunk so your left / right arm swings freely. Your arm should swing because of the motion of your body, not because you are using your arm or shoulder muscles. Keep moving your hips  and trunk so your arm swings in the following directions, as told by your health care provider: Side to side. Forward and backward. In clockwise and counterclockwise circles. Continue each motion for __________ seconds, or for as long as told by your health care provider. Slowly return to the starting position. Repeat __________ times. Complete this exercise __________ times a day. Shoulder flexion, standing  Stand and hold a broomstick, a cane, or a similar object. Place your hands a little more than shoulder width apart on the object. Your left / right hand should be palm up, and your other hand should be palm down. Keep your elbow straight and your shoulder muscles relaxed. Push the stick up with your healthy arm to raise your left / right arm in front of your body, and then over your head until you feel a stretch in your shoulder (flexion). Avoid shrugging your shoulder while you raise your arm. Keep your shoulder blade tucked down toward the middle of your back. Hold for __________ seconds. Slowly return to the starting position. Repeat __________ times. Complete this exercise __________ times a day. Shoulder abduction, standing Stand and hold a broomstick, a cane, or a similar object. Place your hands a little more than shoulder width apart on the object. Your left / right hand should be palm up, and your other hand should be palm down. Keep your elbow straight and your shoulder muscles relaxed. Push the object across your body toward your left / right side. Raise your left  / right arm to the side of your body (abduction) until you feel a stretch in your shoulder. Do not raise your arm above shoulder height unless your health care provider tells you to do that. If directed, raise your arm over your head. Avoid shrugging your shoulder while you raise your arm. Keep your shoulder blade tucked down toward the middle of your back. Hold for __________ seconds. Slowly return to the starting position. Repeat __________ times. Complete this exercise __________ times a day. Internal rotation  Place your left / right hand behind your back, palm up. Use your other hand to dangle an exercise band, a towel, or a similar object over your shoulder. Grasp the band with your left / right hand so you are holding on to both ends. Gently pull up on the band until you feel a stretch in the front of your left / right shoulder. The movement of your arm toward the center of your body is called internal rotation. Avoid shrugging your shoulder while you raise your arm. Keep your shoulder blade tucked down toward the middle of your back. Hold for __________ seconds. Release the stretch by letting go of the band and lowering your hands. Repeat __________ times. Complete this exercise __________ times a day. Strengthening exercises External rotation  Sit in a stable chair without armrests. Secure an exercise band to a stable object at elbow height on your left / right side. Place a soft object, such as a folded towel or a small pillow, between your left / right upper arm and your body to move your elbow about 4 inches (10 cm) away from your side. Hold the end of the exercise band so it is tight and there is no slack. Keeping your elbow pressed against the soft object, slowly move your forearm out, away from your abdomen (external rotation). Keep your body steady so only your forearm moves. Hold for __________ seconds. Slowly return to the starting position. Repeat __________ times.  Complete  this exercise __________ times a day. Shoulder abduction  Sit in a stable chair without armrests, or stand up. Hold a __________ weight in your left / right hand, or hold an exercise band with both hands. Start with your arms straight down and your left / right palm facing in, toward your body. Slowly lift your left / right hand out to your side (abduction). Do not lift your hand above shoulder height unless your health care provider tells you that this is safe. Keep your arms straight. Avoid shrugging your shoulder while you do this movement. Keep your shoulder blade tucked down toward the middle of your back. Hold for __________ seconds. Slowly lower your arm, and return to the starting position. Repeat __________ times. Complete this exercise __________ times a day. Shoulder extension Sit in a stable chair without armrests, or stand up. Secure an exercise band to a stable object in front of you so it is at shoulder height. Hold one end of the exercise band in each hand. Your palms should face each other. Straighten your elbows and lift your hands up to shoulder height. Step back, away from the secured end of the exercise band, until the band is tight and there is no slack. Squeeze your shoulder blades together as you pull your hands down to the sides of your thighs (extension). Stop when your hands are straight down by your sides. Do not let your hands go behind your body. Hold for __________ seconds. Slowly return to the starting position. Repeat __________ times. Complete this exercise __________ times a day. Shoulder row Sit in a stable chair without armrests, or stand up. Secure an exercise band to a stable object in front of you so it is at waist height. Hold one end of the exercise band in each hand. Position your palms so that your thumbs are facing the ceiling (neutral position). Bend each of your elbows to a 90-degree angle (right angle) and keep your upper arms at your  sides. Step back until the band is tight and there is no slack. Slowly pull your elbows back behind you. Hold for __________ seconds. Slowly return to the starting position. Repeat __________ times. Complete this exercise __________ times a day. Shoulder press-ups  Sit in a stable chair that has armrests. Sit upright, with your feet flat on the floor. Put your hands on the armrests so your elbows are bent and your fingers are pointing forward. Your hands should be about even with the sides of your body. Push down on the armrests and use your arms to lift yourself off the chair. Straighten your elbows and lift yourself up as much as you comfortably can. Move your shoulder blades down, and avoid letting your shoulders move up toward your ears. Keep your feet on the ground. As you get stronger, your feet should support less of your body weight as you lift yourself up. Hold for __________ seconds. Slowly lower yourself back into the chair. Repeat __________ times. Complete this exercise __________ times a day. Wall push-ups  Stand so you are facing a stable wall. Your feet should be about one arm-length away from the wall. Lean forward and place your palms on the wall at shoulder height. Keep your feet flat on the floor as you bend your elbows and lean forward toward the wall. Hold for __________ seconds. Straighten your elbows to push yourself back to the starting position. Repeat __________ times. Complete this exercise __________ times a day. This information is not intended  to replace advice given to you by your health care provider. Make sure you discuss any questions you have with your health care provider. Document Revised: 10/18/2018 Document Reviewed: 07/26/2018 Elsevier Patient Education  2022 [employer].     If you have been instructed to have an in-person evaluation today at a local Urgent Care facility, please use the link below. It will take you to a list of all of our  available [facility], including address, phone number and hours of operation. Please do not delay care.  [facility]  If you or a family member do not have a primary care provider, use the link below to schedule a visit and establish care. When you choose a [facility] primary care physician or advanced practice provider, you gain a long-term partner in health. Find a Primary Care Provider  Learn more about [facility]'s in-office and virtual care options: [facility]

## 2021-03-30 ENCOUNTER — Other Ambulatory Visit (HOSPITAL_BASED_OUTPATIENT_CLINIC_OR_DEPARTMENT_OTHER): Payer: Self-pay

## 2021-04-05 ENCOUNTER — Other Ambulatory Visit (HOSPITAL_COMMUNITY): Payer: Self-pay

## 2021-04-07 ENCOUNTER — Other Ambulatory Visit (HOSPITAL_COMMUNITY): Payer: Self-pay

## 2021-04-08 ENCOUNTER — Other Ambulatory Visit: Payer: Self-pay

## 2021-04-08 ENCOUNTER — Ambulatory Visit: Payer: No Typology Code available for payment source | Attending: Registered Nurse | Admitting: Pharmacist

## 2021-04-08 DIAGNOSIS — Z79899 Other long term (current) drug therapy: Secondary | ICD-10-CM

## 2021-04-08 NOTE — Progress Notes (Signed)
S: Patient presents for review of their specialty medication therapy.  Patient is currently taking Humira for Crohn's. Patient is managed by Dr. Lyndel Safe for this.   Adherence: confirms.  Efficacy: thinks that this working well for her. She did have to interrupt therapy for Covid two times but restarted with no issues.   Dosing:  Crohn disease: SubQ (may continue aminosalicylates and/or corticosteroids; if necessary, azathioprine, mercaptopurine, or methotrexate may also be continued): Initial: 160 mg (given as four 40 mg injections on day 1 or given as two 40 mg injections per day over 2 consecutive days), then 80 mg 2 weeks later (day 15). Maintenance: 40 mg every other week beginning day 29. Note: Some patients may require 40 mg every week as maintenance therapy Karl Pock [id number]).  Dose adjustments: Renal: no dose adjustments (has not been studied) Hepatic: no dose adjustments (has not been studied)  Drug-drug interactions: none identified   Screening: TB test: completed 03/10/20; negative  Hepatitis: completed 03/10/20; negative   Monitoring: S/sx of infection: none  CBC: WNL  S/sx of hypersensitivity: none  S/sx of malignancy: none  S/sx of heart failure: none   Other side effects: none   O:   Lab Results  Component Value Date   WBC 7.2 03/10/2020   HGB 12.8 03/10/2020   HCT 39.7 03/10/2020   MCV 77.8 (L) 03/10/2020   PLT 302.0 03/10/2020      Chemistry      Component Value Date/Time   NA 137 05/20/2020 1119   K 4.3 05/20/2020 1119   CL 105 05/20/2020 1119   CO2 28 05/20/2020 1119   BUN 17 05/20/2020 1119   CREATININE 0.68 05/20/2020 1119      Component Value Date/Time   CALCIUM 8.7 05/20/2020 1119   ALKPHOS 47 05/20/2020 1119   AST 19 05/20/2020 1119   ALT 29 05/20/2020 1119   BILITOT 0.5 05/20/2020 1119       A/P: 1. Medication review: Patient currently on Humira for Crohn's. Reviewed the medication with the patient, including the following:  Humira is a TNF blocking agent indicated for ankylosing spondylitis, Crohn's disease, Hidradenitis suppurativa, psoriatic arthritis, plaque psoriasis, ulcerative colitis, and uveitis. Patient educated on purpose, proper use and potential adverse effects of Humira. Possible adverse effects are increased risk of infections, headache, and injection site reactions. There is the possibility of an increased risk of malignancy but it is not well understood if this increased risk is due to there medication or the disease state. There are rare cases of pancytopenia and aplastic anemia. For SubQ injection at separate sites in the thigh or lower abdomen (avoiding areas within 2 inches of navel); rotate injection sites. May leave at room temperature for ~15 to 30 minutes prior to use; do not remove cap or cover while allowing product to reach room temperature. Do not use if solution is discolored or contains particulate matter. Do not administer to skin which is red, tender, bruised, hard, or that has scars, stretch marks, or psoriasis plaques. Needle cap of the prefilled syringe or needle cover for the adalimumab pen may contain latex. Prefilled pens and syringes are available for use by patients and the full amount of the syringe should be injected (self-administration); the vial is intended for institutional use only. Vials do not contain a preservative; discard unused portion. No recommendations for any changes at this time.   Benard Halsted, PharmD, [facility] [place], [place] [phone]

## 2021-04-19 ENCOUNTER — Telehealth: Payer: Self-pay | Admitting: Neurology

## 2021-04-19 NOTE — Telephone Encounter (Signed)
She may try Imitrex injection in combination with Zofran 4 mg, tizanidine 4 mg, Benadryl, 25 mg 1 to 2 tablets, Aleve 1 to 2 tablets, go to sleep,  If she still migraine tomorrow, may call back clinic, I can give her nerve block,  We can refill the prescription that she does not have

## 2021-04-19 NOTE — Telephone Encounter (Signed)
I called patient. She has had a migraine for 3 days. She is not taking effexor or aimovig. The effexor gave her insomnia and the aimovig did not work. She has tried [employer] but with no relief. She took 3 of her husband's percocet last night to get some sleep. She was on a medrol dose pack last month for suspected bursitis. I will speak with Dr. Krista Blue and call patient back.

## 2021-04-19 NOTE — Telephone Encounter (Signed)
Pt called states she has been having a Migraine for about 3 days now, and nothing is seeming to help. Pt requesting a call back.

## 2021-04-19 NOTE — Telephone Encounter (Signed)
I called patient. She will follow these instructions and let us know if her migraine does not resolve. She will not drive after these medications but go to sleep.

## 2021-04-21 IMAGING — CT CT ENTEROGRAPHY (ABD-PELV W/ CM)
2 of 5 series · 15 of 46 positions shown, 17 images · IV contrast (OMNIPAQUE 300)
Comparison: None.

CLINICAL DATA: Crohn disease.

EXAM:
CT ABDOMEN AND PELVIS WITH CONTRAST (ENTEROGRAPHY)
TECHNIQUE: Multidetector CT of the abdomen and pelvis during bolus
administration of intravenous contrast. Negative oral contrast was
given.
CONTRAST:  100mL OMNIPAQUE IOHEXOL 300 MG/ML  SOLN

[Series 5: entero thins · axial · 0.81mm/px · z∈[-544,-66]mm · 12 of 267 slices shown, 14 images]
[im 14/267  soft-tissue]
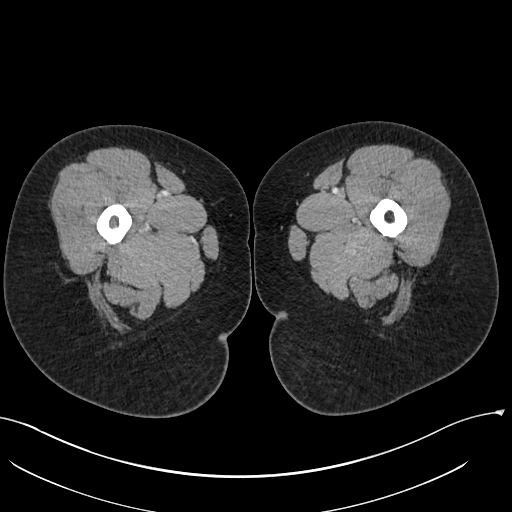
[im 14/267  bone]
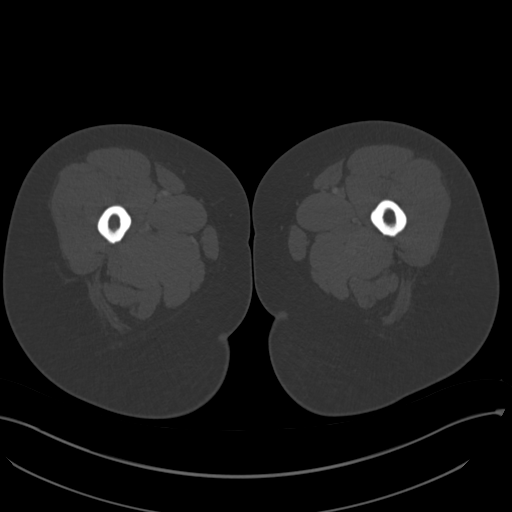
[im 40/267  soft-tissue]
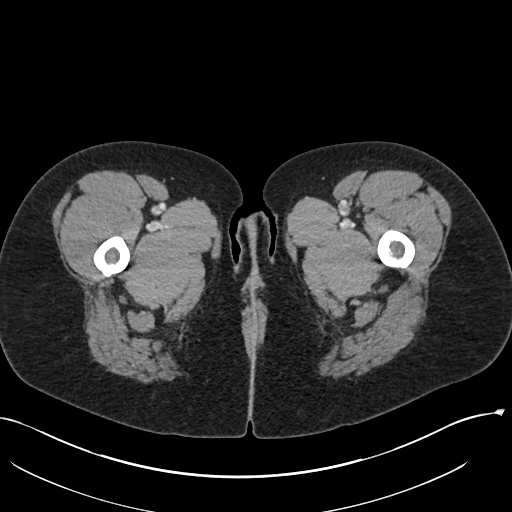
[im 54/267  soft-tissue]
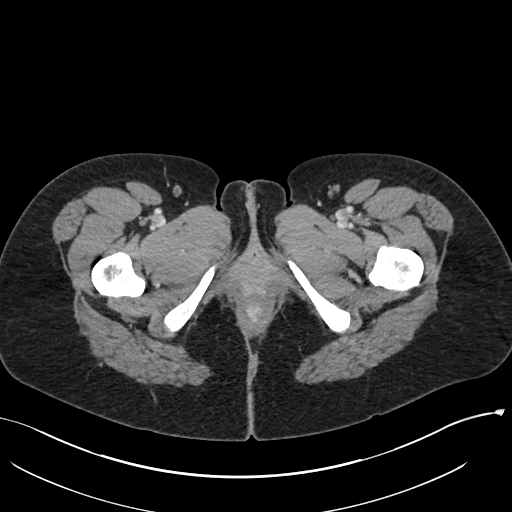
[im 80/267  soft-tissue]
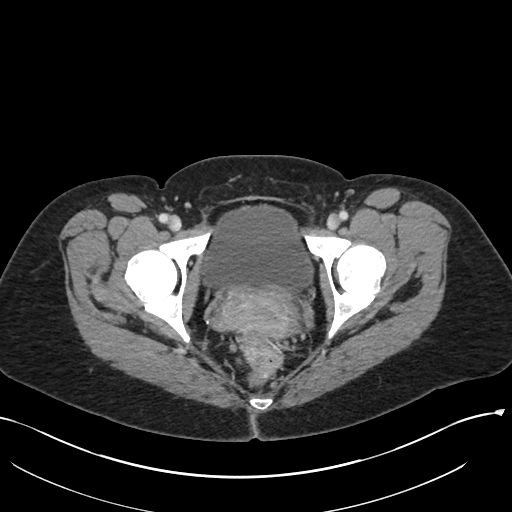
[im 107/267  soft-tissue]
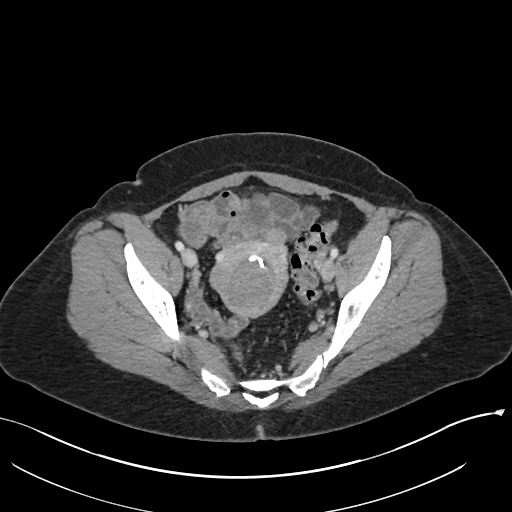
[im 120/267  soft-tissue]
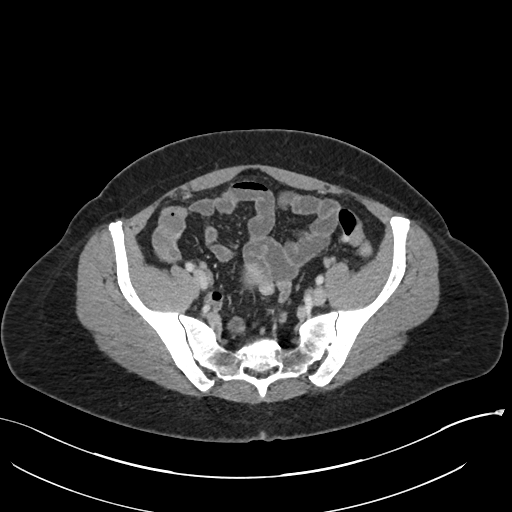
[im 147/267  soft-tissue]
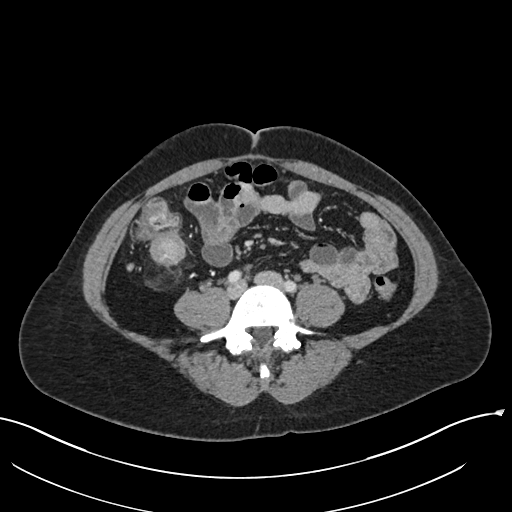
[im 160/267  soft-tissue]
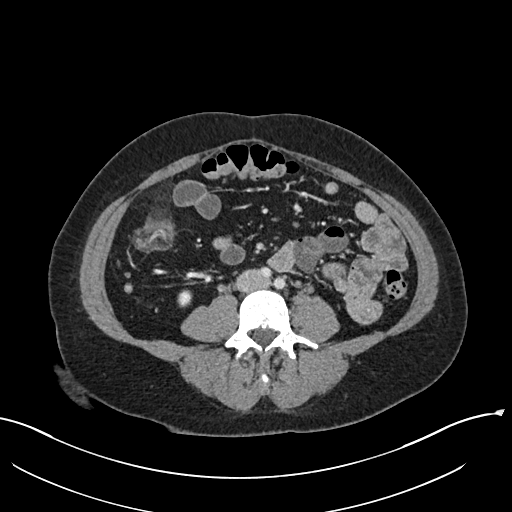
[im 187/267  soft-tissue]
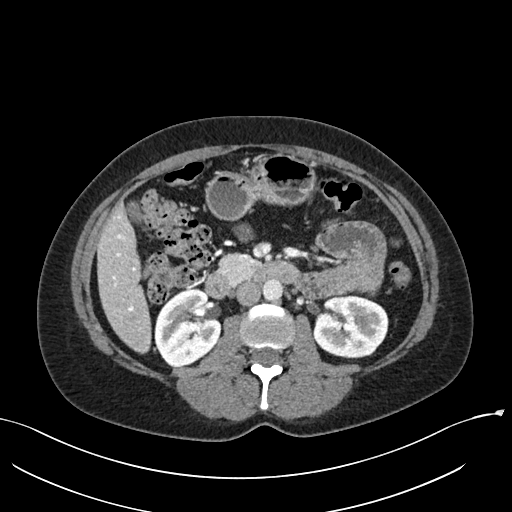
[im 187/267  bone]
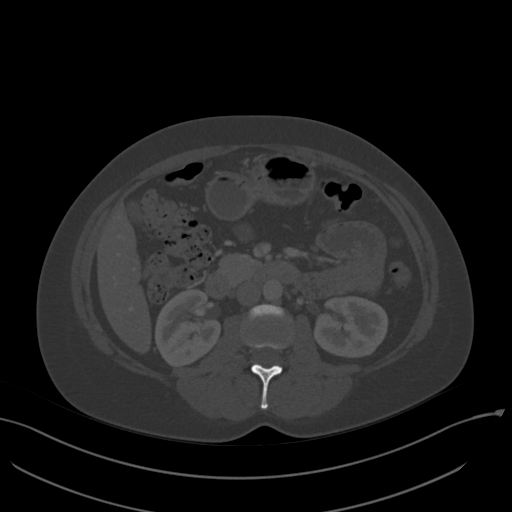
[im 213/267  soft-tissue]
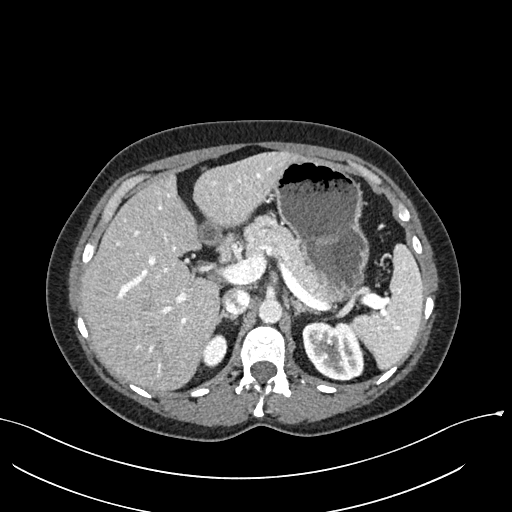
[im 227/267  soft-tissue]
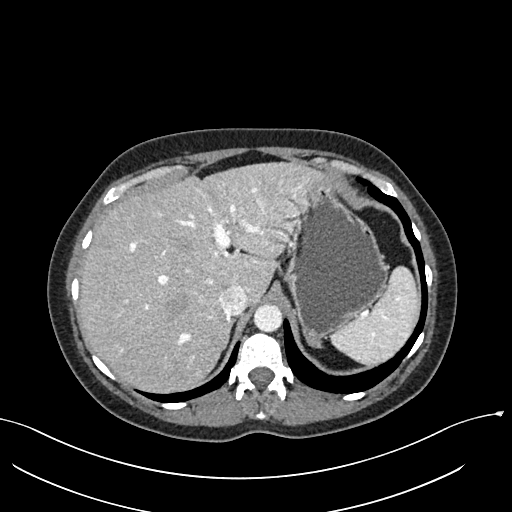
[im 253/267  soft-tissue]
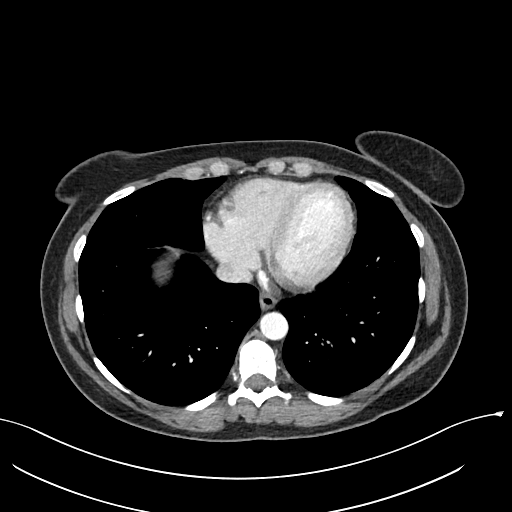

[Series 7: coronal · coronal · 0.66mm/px · 3 of 75 slices shown]
[im 25/75  soft-tissue]
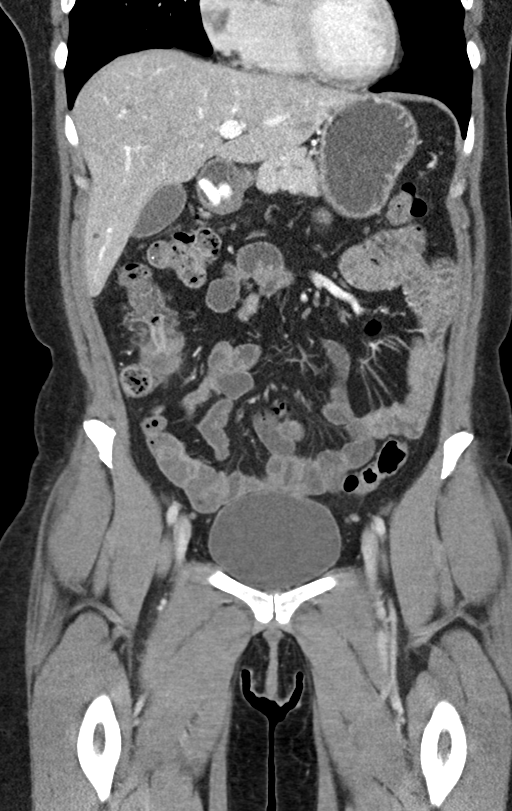
[im 33/75  soft-tissue]
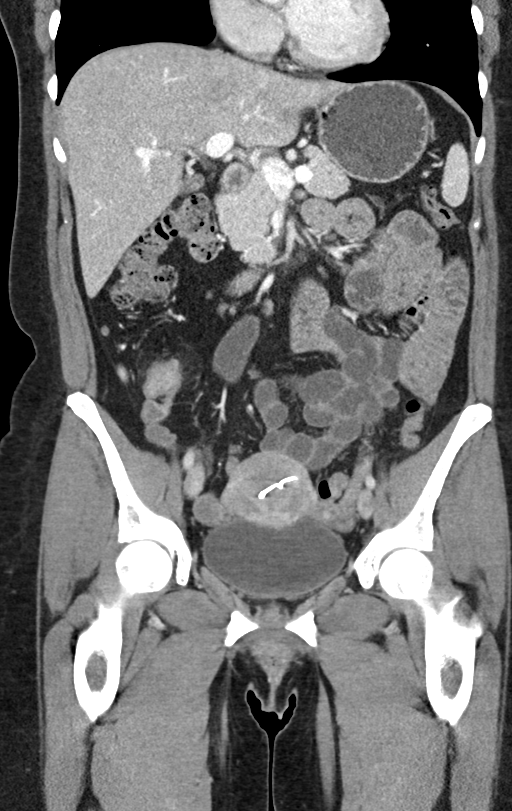
[im 42/75  soft-tissue]
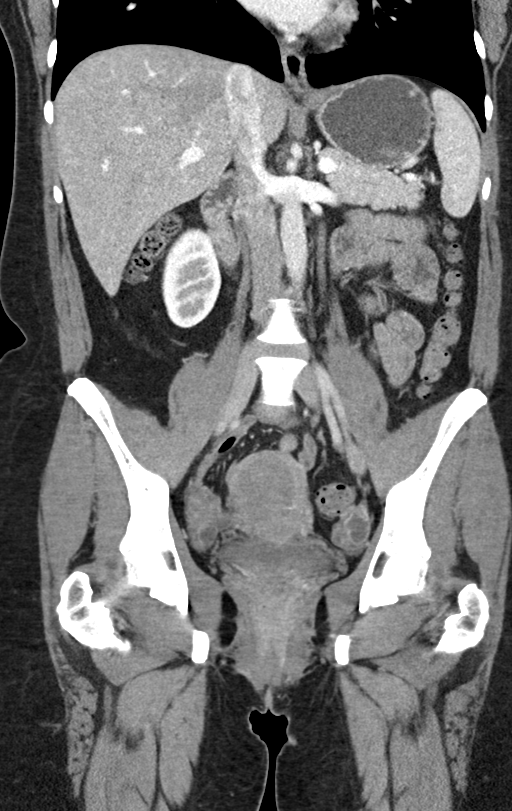

[15 of 46 positions shown; findings below may reference images not displayed]

FINDINGS: Lower Chest: No acute findings.

Hepatobiliary: Several small hepatic cysts are noted. A 7 mm
hypervascular mass is also seen in segment 3 on image 51/5. This
likely represents a benign flash-filling hemangioma, although
differential diagnosis also includes focal nodular hyperplasia and
adenoma. No other liver masses identified. Gallbladder is
unremarkable. No evidence of biliary ductal dilatation.

Pancreas:  No mass or inflammatory changes.

Spleen: Within normal limits in size and appearance.

Adrenals/Urinary Tract: No masses identified. No evidence of
ureteral calculi or hydronephrosis.

Stomach/Bowel: Moderate wall thickening and mucosal enhancement is
seen involving the terminal ileum, and there are mild inflammatory
changes in the adjacent mesenteric fat. This is consistent with
active Crohn disease. No evidence of fistula, abscess, or bowel
obstruction. No other sites of bowel involvement seen. Normal
appendix visualized.

Vascular/Lymphatic: No pathologically enlarged lymph nodes. No
abdominal aortic aneurysm.

Reproductive: An IUD is noted. A fibroid is seen in the posterior
uterine corpus and fundus which measures 4.5 cm. Adnexal regions are
unremarkable.

Other:  None.

Musculoskeletal:  No suspicious bone lesions identified.
IMPRESSION: Active Crohn disease involving the terminal ileum. No evidence of
fistula, abscess, or bowel obstruction.

4.5 cm posterior uterine fibroid and IUD.

7 mm hypervascular mass in left hepatic lobe likely represents a
benign flash-filling hemangioma, although differential diagnosis
also includes focal nodular hyperplasia and adenoma. Recommend
follow-up by abdomen MRI without and with contrast in 6 months.

## 2021-05-02 ENCOUNTER — Other Ambulatory Visit (HOSPITAL_COMMUNITY): Payer: Self-pay

## 2021-05-02 ENCOUNTER — Other Ambulatory Visit (HOSPITAL_BASED_OUTPATIENT_CLINIC_OR_DEPARTMENT_OTHER): Payer: Self-pay

## 2021-05-02 ENCOUNTER — Other Ambulatory Visit: Payer: Self-pay | Admitting: Neurology

## 2021-05-02 MED FILL — Ondansetron Orally Disintegrating Tab 4 MG: ORAL | 7 days supply | Qty: 20 | Fill #0 | Status: AC

## 2021-05-03 ENCOUNTER — Other Ambulatory Visit (HOSPITAL_BASED_OUTPATIENT_CLINIC_OR_DEPARTMENT_OTHER): Payer: Self-pay

## 2021-05-03 ENCOUNTER — Other Ambulatory Visit (HOSPITAL_COMMUNITY): Payer: Self-pay

## 2021-05-03 MED ORDER — RIZATRIPTAN BENZOATE 10 MG PO TBDP
ORAL_TABLET | ORAL | 0 refills | Status: DC
  Filled 2021-05-03: qty 15, 30d supply, fill #0

## 2021-05-03 NOTE — Telephone Encounter (Signed)
Please see Note below and advise

## 2021-05-04 ENCOUNTER — Other Ambulatory Visit: Payer: Self-pay

## 2021-05-04 ENCOUNTER — Other Ambulatory Visit (HOSPITAL_COMMUNITY): Payer: Self-pay

## 2021-05-04 DIAGNOSIS — K50919 Crohn's disease, unspecified, with unspecified complications: Secondary | ICD-10-CM

## 2021-05-17 ENCOUNTER — Other Ambulatory Visit (HOSPITAL_BASED_OUTPATIENT_CLINIC_OR_DEPARTMENT_OTHER): Payer: Self-pay

## 2021-05-17 MED ORDER — METRONIDAZOLE 500 MG PO TABS
ORAL_TABLET | ORAL | 0 refills | Status: DC
  Filled 2021-05-17: qty 14, 7d supply, fill #0

## 2021-05-18 ENCOUNTER — Other Ambulatory Visit (HOSPITAL_BASED_OUTPATIENT_CLINIC_OR_DEPARTMENT_OTHER): Payer: Self-pay

## 2021-05-18 MED FILL — Losartan Potassium Tab 50 MG: ORAL | 30 days supply | Qty: 30 | Fill #2 | Status: AC

## 2021-05-23 ENCOUNTER — Emergency Department (INDEPENDENT_AMBULATORY_CARE_PROVIDER_SITE_OTHER)
Admission: EM | Admit: 2021-05-23 | Discharge: 2021-05-23 | Disposition: A | Discharge status: Home / Self Care | Payer: No Typology Code available for payment source | Source: Home / Self Care

## 2021-05-23 ENCOUNTER — Encounter: Payer: Self-pay | Admitting: Emergency Medicine

## 2021-05-23 ENCOUNTER — Other Ambulatory Visit: Payer: Self-pay

## 2021-05-23 ENCOUNTER — Other Ambulatory Visit (HOSPITAL_COMMUNITY): Payer: Self-pay

## 2021-05-23 DIAGNOSIS — B349 Viral infection, unspecified: Secondary | ICD-10-CM | POA: Diagnosis not present

## 2021-05-23 LAB — POCT INFLUENZA A/B
Influenza A, POC: NEGATIVE
Influenza B, POC: NEGATIVE

## 2021-05-23 NOTE — ED Provider Notes (Signed)
[name]    CSN: [id number] Arrival date & time: 05/23/21  1413      History   Chief Complaint Chief Complaint  Patient presents with   Generalized Body Aches   flu exposure    HPI Amy Thomas is a 40 y.o. female.   HPI 40 year old female presents with chills and body aches for 1 day.  Patient reports nephew had flu last week.  Past Medical History:  Diagnosis Date   Anxiety    Blood transfusion without reported diagnosis    Chronic headaches    Colon polyp    Crohn's disease (HCC)    Depression    GERD (gastroesophageal reflux disease)    Heart murmur    Hypertension    Migraine    Neck pain     Patient Active Problem List   Diagnosis Date Noted   Chronic migraine w/o aura w/o status migrainosus, not intractable 06/17/2020   Neck pain 06/17/2020    Past Surgical History:  Procedure Laterality Date   CERVICAL BIOPSY  W/ LOOP ELECTRODE EXCISION     to remove precancous cells    COLONOSCOPY  2017   [facility]   LYMPHADENECTOMY Left    TONSILLECTOMY AND ADENOIDECTOMY      OB History   No obstetric history on file.      Home Medications    Prior to Admission medications   Medication Sig Start Date End Date Taking? Authorizing Provider  Adalimumab (HUMIRA PEN) 40 MG/0.4ML PNKT INJECT 40 MG INTO THE SKIN EVERY 14 (FOURTEEN) DAYS. 12/10/20 12/10/21  Tresa Garter, MD  COVID-19 At Home Antigen Test [facility] COVID-19 HOME TEST) KIT Use as directed 01/03/21   Jefm Bryant, RPH  Erenumab-aooe 70 MG/ML SOAJ INJECT 70 MG INTO THE SKIN EVERY 30 (THIRTY) DAYS. Patient not taking: Reported on 05/23/2021 06/17/20 06/17/21  Marcial Pacas, MD  Ferrous Sulfate (IRON SUPPLEMENT PO) Take 2 tablets by mouth daily.    [provider]  FLUoxetine (PROZAC) 10 MG capsule TAKE 1 CAPSULE BY MOUTH ONCE DAILY 03/17/20 03/17/21  Clydie Braun T, FNP  levonorgestrel (MIRENA) 20 MCG/24HR IUD 1 each by Intrauterine route once.     [provider]  losartan (COZAAR) 50 MG tablet Take 50 mg by mouth daily. Patient not taking: Reported on 05/23/2021 10/28/19   [provider]  losartan (COZAAR) 50 MG tablet TAKE 1 TABLET BY MOUTH ONCE DAILY 09/30/20 09/30/21  Clydie Braun T, FNP  losartan (COZAAR) 50 MG tablet TAKE 1 TABLET BY MOUTH ONCE DAILY 03/29/20 03/29/21  Clydie Braun T, FNP  losartan (COZAAR) 50 MG tablet Take 1 tablet (50 mg total) by mouth daily. 03/28/21     losartan (COZAAR) 50 MG tablet TAKE 1 TABLET BY MOUTH ONCE DAILY 09/30/20 09/30/21  Clydie Braun T, FNP  methylPREDNISolone (MEDROL DOSEPAK) 4 MG TBPK tablet take by mouth as directed on package instructions 03/28/21   Mar Daring, PA-C  metroNIDAZOLE (FLAGYL) 500 MG tablet Take 1 tablet (500 mg total) by mouth 2 (two) times daily. 03/15/21   Brunetta Jeans, PA-C  metroNIDAZOLE (FLAGYL) 500 MG tablet Take 1 tablet by mouth every 12 hours for 7 days. 05/17/21     ondansetron (ZOFRAN-ODT) 4 MG disintegrating tablet DISSOLVE 1 TABLET (4 MG TOTAL) BY MOUTH EVERY 8 (EIGHT) HOURS AS NEEDED. 06/17/20 06/17/21  Marcial Pacas, MD  oxyCODONE (OXY IR/ROXICODONE) 5 MG immediate release tablet Take 1 tablet (5 mg total) by mouth every  4 (four) hours as needed for pain 11/08/20     promethazine (PHENERGAN) 12.5 MG tablet TAKE 1 TO 2 TABLETS BY MOUTH EVERY 8 HOURS AS NEEDED FOR NAUSEA 04/01/20 04/01/21  Clydie Braun T, FNP  rizatriptan (MAXALT-MLT) 10 MG disintegrating tablet TAKE 1 TABLET (10 MG TOTAL) BY MOUTH AS NEEDED. MAY REPEAT IN 2 HOURS IF NEEDED 05/03/21   Marcial Pacas, MD  SUMAtriptan (IMITREX) 6 MG/0.5ML SOLN injection INJECT 0.5MLS INTO THE SKIN EVERY 2 HOURS AS NEEDED FOR MIGRAINE OR HEADACHES. MAY REPEAT IN 2 HOURS IF HEADACHE PERSISTS OR RECURS 08/23/20 08/23/21  Marcial Pacas, MD  tiZANidine (ZANAFLEX) 4 MG tablet TAKE 1 TABLET (4 MG TOTAL) BY MOUTH EVERY 6 (SIX) HOURS AS NEEDED FOR MUSCLE SPASMS. 06/17/20 06/17/21  Marcial Pacas, MD   venlafaxine XR (EFFEXOR-XR) 37.5 MG 24 hr capsule TAKE 2 CAPSULES BY MOUTH DAILY WITH BREAKFAST 08/23/20 08/23/21  Marcial Pacas, MD    Family History Family History  Problem Relation Age of Onset   Ovarian cancer Mother    Migraines Mother    Uterine cancer Maternal Grandmother    Heart disease Maternal Grandmother    Diabetes Maternal Grandmother    Stomach cancer Maternal Uncle    Other Father        no medical history of father   Colon cancer Neg Hx    Esophageal cancer Neg Hx     Social History Social History   Tobacco Use   Smoking status: Former    Types: Cigarettes    Quit date: 03/10/2014    Years since quitting: 7.2   Smokeless tobacco: Never  Vaping Use   Vaping Use: Never used  Substance Use Topics   Alcohol use: Yes    Comment: occasionally   Drug use: Never     Allergies   Cefaclor   Review of Systems Review of Systems  Constitutional:  Positive for chills.  Musculoskeletal:  Positive for myalgias.  All other systems reviewed and are negative.   Physical Exam Triage Vital Signs ED Triage Vitals  Enc Vitals Group     BP 05/23/21 1442 133/89     Pulse Rate 05/23/21 1442 86     Resp 05/23/21 1442 15     Temp 05/23/21 1442 98.9 F (37.2 C)     Temp Source 05/23/21 1442 Oral     SpO2 05/23/21 1442 99 %     Weight 05/23/21 1447 183 lb (83 kg)     Height 05/23/21 1447 _0  (1.702 m)     Head Circumference --      Peak Flow --      Pain Score 05/23/21 1447 2     Pain Loc --      Pain Edu? --      Excl. in [place]? --    No data found.  Updated Vital Signs BP 133/89 (BP Location: Right Arm)   Pulse 86   Temp 98.9 F (37.2 C) (Oral)   Resp 15   Ht _1  (1.702 m)   Wt 183 lb (83 kg)   SpO2 99%   BMI 28.66 kg/m   Physical Exam Vitals and nursing note reviewed.  Constitutional:      General: She is not in acute distress.    Appearance: Normal appearance. She is obese.  HENT:     Head: Normocephalic and atraumatic.     Right Ear:  Tympanic membrane, ear canal and external ear normal.     Left Ear: Tympanic  membrane, ear canal and external ear normal.     Mouth/Throat:     Mouth: Mucous membranes are moist.     Pharynx: Oropharynx is clear.  Eyes:     Extraocular Movements: Extraocular movements intact.     Conjunctiva/sclera: Conjunctivae normal.     Pupils: Pupils are equal, round, and reactive to light.  Cardiovascular:     Rate and Rhythm: Normal rate and regular rhythm.     Pulses: Normal pulses.     Heart sounds: Normal heart sounds.  Pulmonary:     Effort: Pulmonary effort is normal.     Breath sounds: Normal breath sounds.  Musculoskeletal:        General: Normal range of motion.     Cervical back: Normal range of motion and neck supple.  Skin:    General: Skin is warm and dry.  Neurological:     General: No focal deficit present.     Mental Status: She is alert and oriented to person, place, and time.     UC Treatments / Results  Labs (all labs ordered are listed, but only abnormal results are displayed) Labs Reviewed  POCT INFLUENZA A/B    EKG   Radiology No results found.  Procedures Procedures (including critical care time)  Medications Ordered in UC Medications - No data to display  Initial Impression / Assessment and Plan / UC Course  I have reviewed the triage vital signs and the nursing notes.  Pertinent labs & imaging results that were available during my care of the patient were reviewed by me and considered in my medical decision making (see chart for details).     MDM: Generalized body aches-Influenza negative. Advised patient may alternate between OTC Tylenol 1000 mg and OTC Ibuprofen 600 mg 1-2 times daily, as needed for fever and myalgias.  Encouraged patient to increase daily water intake when taking these medications.  Work note provided per patient request. Patient discharged home, hemodynamically stable. Final Clinical Impressions(s) / UC Diagnoses   Final  diagnoses:  Viral illness     Discharge Instructions      Advised patient may alternate between OTC Tylenol 1000 mg and OTC Ibuprofen 600 mg 1-2 times daily, as needed for fever and myalgias.  Encouraged patient to increase daily water intake when taking these medications.     ED Prescriptions   None    PDMP not reviewed this encounter.   Eliezer Lofts, [place] 05/23/21 1553

## 2021-05-23 NOTE — ED Triage Notes (Addendum)
Watched nephew last week who were dx w/ flu on sat  Pt had a flu vaccine this year  Chills & body aches since yesterday OTC tylenol  & theraflu Last tylenol at 0600

## 2021-05-23 NOTE — Discharge Instructions (Addendum)
Advised patient may alternate between OTC Tylenol 1000 mg and OTC Ibuprofen 600 mg 1-2 times daily, as needed for fever and myalgias.  Encouraged patient to increase daily water intake when taking these medications.

## 2021-05-25 ENCOUNTER — Other Ambulatory Visit (HOSPITAL_COMMUNITY): Payer: Self-pay

## 2021-06-01 ENCOUNTER — Other Ambulatory Visit (HOSPITAL_COMMUNITY): Payer: Self-pay

## 2021-06-09 ENCOUNTER — Other Ambulatory Visit (HOSPITAL_BASED_OUTPATIENT_CLINIC_OR_DEPARTMENT_OTHER): Payer: Self-pay

## 2021-06-09 ENCOUNTER — Other Ambulatory Visit: Payer: Self-pay | Admitting: Neurology

## 2021-06-09 MED FILL — Sumatriptan Succinate Inj 6 MG/0.5ML: SUBCUTANEOUS | 15 days supply | Qty: 2.5 | Fill #1 | Status: CN

## 2021-06-09 MED FILL — Tizanidine HCl Tab 4 MG (Base Equivalent): ORAL | 8 days supply | Qty: 30 | Fill #2 | Status: AC

## 2021-06-10 ENCOUNTER — Other Ambulatory Visit (HOSPITAL_BASED_OUTPATIENT_CLINIC_OR_DEPARTMENT_OTHER): Payer: Self-pay

## 2021-06-14 ENCOUNTER — Other Ambulatory Visit (HOSPITAL_COMMUNITY): Payer: Self-pay

## 2021-06-17 ENCOUNTER — Other Ambulatory Visit (HOSPITAL_BASED_OUTPATIENT_CLINIC_OR_DEPARTMENT_OTHER): Payer: Self-pay

## 2021-06-27 ENCOUNTER — Other Ambulatory Visit (HOSPITAL_COMMUNITY): Payer: Self-pay

## 2021-06-29 ENCOUNTER — Other Ambulatory Visit (HOSPITAL_COMMUNITY): Payer: Self-pay

## 2021-07-21 ENCOUNTER — Ambulatory Visit: Payer: No Typology Code available for payment source | Admitting: Neurology

## 2021-07-21 ENCOUNTER — Telehealth: Payer: Self-pay | Admitting: *Deleted

## 2021-07-21 ENCOUNTER — Encounter: Payer: Self-pay | Admitting: Neurology

## 2021-07-21 ENCOUNTER — Other Ambulatory Visit (HOSPITAL_BASED_OUTPATIENT_CLINIC_OR_DEPARTMENT_OTHER): Payer: Self-pay

## 2021-07-21 VITALS — BP 155/119 | HR 84 | Ht 67.0 in | Wt 183.0 lb

## 2021-07-21 DIAGNOSIS — F5104 Psychophysiologic insomnia: Secondary | ICD-10-CM | POA: Diagnosis not present

## 2021-07-21 DIAGNOSIS — G4733 Obstructive sleep apnea (adult) (pediatric): Secondary | ICD-10-CM | POA: Diagnosis not present

## 2021-07-21 DIAGNOSIS — F419 Anxiety disorder, unspecified: Secondary | ICD-10-CM | POA: Diagnosis not present

## 2021-07-21 DIAGNOSIS — G43709 Chronic migraine without aura, not intractable, without status migrainosus: Secondary | ICD-10-CM

## 2021-07-21 MED ORDER — ONDANSETRON 4 MG PO TBDP
4.0000 mg | ORAL_TABLET | Freq: Three times a day (TID) | ORAL | 6 refills | Status: DC | PRN
Start: 2021-07-21 — End: 2023-07-18
  Filled 2021-07-21: qty 20, 7d supply, fill #0

## 2021-07-21 MED ORDER — NORTRIPTYLINE HCL 25 MG PO CAPS
50.0000 mg | ORAL_CAPSULE | Freq: Every day | ORAL | 11 refills | Status: DC
  Filled 2021-07-21: qty 60, 30d supply, fill #0
  Filled 2021-08-22: qty 60, 30d supply, fill #1
  Filled 2021-09-20: qty 60, 30d supply, fill #2
  Filled 2021-10-24: qty 60, 30d supply, fill #3
  Filled 2021-12-04: qty 60, 30d supply, fill #4
  Filled 2022-01-24: qty 60, 30d supply, fill #5

## 2021-07-21 MED ORDER — TIZANIDINE HCL 4 MG PO TABS
4.0000 mg | ORAL_TABLET | Freq: Four times a day (QID) | ORAL | 6 refills | Status: DC | PRN
  Filled 2021-07-21: qty 30, 8d supply, fill #0
  Filled 2021-08-22: qty 30, 8d supply, fill #1
  Filled 2021-10-24: qty 30, 8d supply, fill #2
  Filled 2022-01-30: qty 30, 8d supply, fill #3
  Filled 2022-02-25: qty 30, 8d supply, fill #4
  Filled 2022-06-21: qty 30, 8d supply, fill #5

## 2021-07-21 MED ORDER — RIZATRIPTAN BENZOATE 10 MG PO TBDP
10.0000 mg | ORAL_TABLET | ORAL | 11 refills | Status: DC | PRN
  Filled 2021-07-21: qty 12, 30d supply, fill #0
  Filled 2021-08-22: qty 12, 30d supply, fill #1
  Filled 2021-09-29 – 2021-10-24 (×2): qty 12, 30d supply, fill #2
  Filled 2021-11-13: qty 12, 30d supply, fill #3
  Filled 2021-11-17: qty 12, 30d supply, fill #0
  Filled 2021-12-23: qty 12, 30d supply, fill #1
  Filled 2022-02-25: qty 1, 3d supply, fill #2
  Filled 2022-02-27: qty 11, 27d supply, fill #2
  Filled 2022-06-21: qty 12, 30d supply, fill #3

## 2021-07-21 MED ORDER — EMGALITY 120 MG/ML ~~LOC~~ SOAJ
120.0000 mg | SUBCUTANEOUS | 11 refills | Status: DC
  Filled 2021-07-21: qty 2, 28d supply, fill #0

## 2021-07-21 MED FILL — Losartan Potassium Tab 50 MG: ORAL | 30 days supply | Qty: 30 | Fill #3 | Status: AC

## 2021-07-21 NOTE — Telephone Encounter (Signed)
PA for Emgality started on covermymeds (key: [id number]). Pt has coverage through [employer] [phone]). Decision pending.

## 2021-07-21 NOTE — Patient Instructions (Signed)
Meds ordered this encounter  Medications   Galcanezumab-gnlm (EMGALITY) 120 MG/ML SOAJ    Sig: 120mg  twice for initial SQ injection, then 120mg  Sq monthy    Dispense:  2 mL    Refill:  11   nortriptyline (PAMELOR) 25 MG capsule    Sig: Take 2 capsules (50 mg total) by mouth at bedtime.    Dispense:  60 capsule    Refill:  11   rizatriptan (MAXALT-MLT) 10 MG disintegrating tablet    Sig: Take 1 tablet (10 mg total) by mouth as needed. May repeat in 2 hours if needed    Dispense:  12 tablet    Refill:  11   ondansetron (ZOFRAN-ODT) 4 MG disintegrating tablet    Sig: Take 1 tablet (4 mg total) by mouth every 8 (eight) hours as needed for nausea or vomiting.    Dispense:  20 tablet    Refill:  6   tiZANidine (ZANAFLEX) 4 MG tablet    Sig: Take 1 tablet (4 mg total) by mouth every 6 (six) hours as needed for muscle spasms.    Dispense:  30 tablet    Refill:  6     When you have started with the moderate to severe headaches, may try headache cocktail, Maxalt with zofran for nausea, tizanidine for muscle relaxant, Aleve as needed, and go to sleep

## 2021-07-21 NOTE — Progress Notes (Signed)
Chief Complaint  Patient presents with   Follow-up    Room 14, alone  Pt is currently not taking anything for migraines, states Effexor affected he sleep, would like to discuss other preventative medications  C/o of migraine today, took 2 goody powders this am     ASSESSMENT AND PLAN  Amy Thomas is a 41 y.o. female   Chronic migraine headaches Chronic insomnia, anxiety  Worsening headache recently  No longer on any preventive medications,  Previously tried Elavil 50 mg, lost his benefit eventually, Effexor XR does help her headache, but complains of difficulty sleeping, tried aimovig 70 mg subcutaneous every month for 3 months without significant improvement,  Will try nortriptyline 50 mg, Emgality 120 milligrams every month, as preventive medication   Maxalt as needed works well as abortive treatment, may combine with Zofran, tizanidine, Aleve for severe prolonged headaches At risk for obstructive sleep apnea  Very narrow oropharyngeal space, she also reported frequent snoring, awakening at nighttime, dry mouth in the morning, also involving headaches,  Refer to sleep study   DIAGNOSTIC DATA (LABS, IMAGING, TESTING) - I reviewed patient records, labs, notes, testing and imaging myself where available.  HISTORICAL  Amy Thomas is a 41 year old female, seen in request by her primary care nurse practitioner Clydie Braun T for evaluation of migraine headache, initial evaluation was on June 17, 2020.  I reviewed and summarized the referring note.  Past medical history Crohn's disease, receiving Humira treatment,  Reported history of migraine headaches since 2011, her headache started from right upper neck, occipital region, spreading forward, subtle behind the right eye, with light noise smell sensitivity, lasting for few days, previously tried Imitrex as needed was helpful, but even with triptan treatment, her headaches sometimes last 3 to 5 days, she  complains of worsening headache since September 2021, has to use FMLA leave,   She was under neurologist Dr. Jannifer Franklin care in the past, amitriptyline 50 mg every night as preventive medication initially was helpful, eventually lost its benefit, recently tried 2 months of Ajovy subcutaneous injection from primary care doctor, without helping her migraine  She is now having migraine 2-3 times a week, used up her 9 tablets of Imitrex 100 mg every month, use frequent Goody powders, 3 times a week, sometimes twice a day, this has caused her worsening GI symptoms.  UPDATE Jul 21 2021: She lost to follow-up since December 2021, tried aimovig 70 subcutaneous for 3 months without significant benefit, Effexor XR was helping her headache, but she complains of increased difficulty, despite them trying adjusting schedule from nighttime to daytime, eventually stopped taking her medications  She has been on a preventive medication from many months complains of increased headache, especially since December 2022, daily headache, she is taking daily BC powder, history of Crohn's, does cause some side effect  In addition, she complains of increased insomnia, frequent awakening at nighttime, mild snoring, excessive daytime sleepiness, fatigue, early morning dry mouth, frequent morning headaches  Most of her migraine started from right cervical region, spreading to settle behind right retro-orbital, light noise sensitivity, nauseous, lasting for few hours, Maxalt works well as abortive medications  REVIEW OF SYSTEMS: Full 14 system review of systems performed and notable only for as above All other review of systems were negative.   PHYSICAL EXAM   Vitals:   07/21/21 0737  BP: (!) 155/119  Pulse: 84  Weight: 183 lb (83 kg)  Height: 5\' 7"  (1.702 m)   Not recorded  Body mass index is 28.66 kg/m.  PHYSICAL EXAMNIATION:  Gen: NAD, conversant, well nourised, well groomed          NEUROLOGICAL  EXAM:  MENTAL STATUS: Speech/cognition: Awake, alert, oriented to history taking and casual conversation   CRANIAL NERVES: CN II: Visual fields are full to confrontation. Pupils are round equal and briskly reactive to light. CN III, IV, VI: extraocular movement are normal. No ptosis. CN V: Facial sensation is intact to light touch CN VII: Face is symmetric with normal eye closure  CN VIII: Hearing is normal to causal conversation. CN IX, X: Phonation is normal. CN XI: Head turning and shoulder shrug are intact CN XII: Narrow oropharyngeal space  MOTOR: There is no pronator drift of out-stretched arms. Muscle bulk and tone are normal. Muscle strength is normal.  REFLEXES: Reflexes are 2+ and symmetric at the biceps, triceps, knees, and ankles. Plantar responses are flexor.  SENSORY: Intact to light touch, pinprick and vibratory sensation are intact in fingers and toes.  COORDINATION: There is no trunk or limb dysmetria noted.  GAIT/STANCE: Posture is normal. Gait is steady  ALLERGIES: Allergies  Allergen Reactions   Cefaclor Shortness Of Breath    HOME MEDICATIONS: Current Outpatient Medications  Medication Sig Dispense Refill   Adalimumab (HUMIRA PEN) 40 MG/0.4ML PNKT INJECT 40 MG INTO THE SKIN EVERY 14 (FOURTEEN) DAYS. 2 each 6   Ferrous Sulfate (IRON SUPPLEMENT PO) Take 2 tablets by mouth daily.     levonorgestrel (MIRENA) 20 MCG/24HR IUD 1 each by Intrauterine route once.     losartan (COZAAR) 50 MG tablet TAKE 1 TABLET BY MOUTH ONCE DAILY 30 tablet 3   promethazine (PHENERGAN) 12.5 MG tablet TAKE 1 TO 2 TABLETS BY MOUTH EVERY 8 HOURS AS NEEDED FOR NAUSEA 180 tablet 1   No current facility-administered medications for this visit.    PAST MEDICAL HISTORY: Past Medical History:  Diagnosis Date   Anxiety    Blood transfusion without reported diagnosis    Chronic headaches    Colon polyp    Crohn's disease ([place])    Depression    GERD (gastroesophageal reflux  disease)    Heart murmur    Hypertension    Migraine    Neck pain     PAST SURGICAL HISTORY: Past Surgical History:  Procedure Laterality Date   CERVICAL BIOPSY  W/ LOOP ELECTRODE EXCISION     to remove precancous cells    COLONOSCOPY  2017   [facility]   LYMPHADENECTOMY Left    TONSILLECTOMY AND ADENOIDECTOMY      FAMILY HISTORY: Family History  Problem Relation Age of Onset   Ovarian cancer Mother    Migraines Mother    Uterine cancer Maternal Grandmother    Heart disease Maternal Grandmother    Diabetes Maternal Grandmother    Stomach cancer Maternal Uncle    Other Father        no medical history of father   Colon cancer Neg Hx    Esophageal cancer Neg Hx     SOCIAL HISTORY: Social History   Socioeconomic History   Marital status: Married    Spouse name: Not on file   Number of children: 2   Years of education: college   Highest education level: Not on file  Occupational History   Occupation: X-Ray Tech  Tobacco Use   Smoking status: Former    Types: Cigarettes    Quit date: 03/10/2014    Years since quitting: 7.3  Smokeless tobacco: Never  Vaping Use   Vaping Use: Never used  Substance and Sexual Activity   Alcohol use: Yes    Comment: occasionally   Drug use: Never   Sexual activity: Not on file  Other Topics Concern   Not on file  Social History Narrative   Lives with husband, son and daughter.   Right-handed.   Caffeine use: two cups per day.   Social Determinants of Health   Financial Resource Strain: Not on file  Food Insecurity: Not on file  Transportation Needs: Not on file  Physical Activity: Not on file  Stress: Not on file  Social Connections: Not on file  Intimate Partner Violence: Not on file     Marcial Pacas, M.D. Ph.D.  [facility] [address] [place], [place] [address] Ph: [phone] Fax: [phone]  CC:  Leilani Able, [place],   [id number]

## 2021-07-25 ENCOUNTER — Other Ambulatory Visit (HOSPITAL_COMMUNITY): Payer: Self-pay

## 2021-07-25 ENCOUNTER — Other Ambulatory Visit (HOSPITAL_BASED_OUTPATIENT_CLINIC_OR_DEPARTMENT_OTHER): Payer: Self-pay

## 2021-07-25 NOTE — Telephone Encounter (Signed)
PA approved through 08/22/2021.

## 2021-07-26 ENCOUNTER — Other Ambulatory Visit (HOSPITAL_BASED_OUTPATIENT_CLINIC_OR_DEPARTMENT_OTHER): Payer: Self-pay

## 2021-07-28 ENCOUNTER — Other Ambulatory Visit (HOSPITAL_COMMUNITY): Payer: Self-pay

## 2021-08-01 ENCOUNTER — Other Ambulatory Visit (HOSPITAL_COMMUNITY): Payer: Self-pay

## 2021-08-03 ENCOUNTER — Other Ambulatory Visit (HOSPITAL_BASED_OUTPATIENT_CLINIC_OR_DEPARTMENT_OTHER): Payer: Self-pay

## 2021-08-12 ENCOUNTER — Ambulatory Visit (INDEPENDENT_AMBULATORY_CARE_PROVIDER_SITE_OTHER): Payer: No Typology Code available for payment source | Admitting: Gastroenterology

## 2021-08-12 ENCOUNTER — Encounter: Payer: Self-pay | Admitting: Gastroenterology

## 2021-08-12 ENCOUNTER — Other Ambulatory Visit: Payer: Self-pay

## 2021-08-12 VITALS — BP 112/78 | HR 109 | Ht 67.0 in | Wt 183.2 lb

## 2021-08-12 DIAGNOSIS — D508 Other iron deficiency anemias: Secondary | ICD-10-CM | POA: Diagnosis not present

## 2021-08-12 DIAGNOSIS — K50019 Crohn's disease of small intestine with unspecified complications: Secondary | ICD-10-CM

## 2021-08-12 NOTE — Progress Notes (Signed)
Chief Complaint: FU  Referring Provider:  Farrel Conners*      ASSESSMENT AND PLAN;   #1. 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  Rectal involvement but no perianal Crohn's. On Humira since 04/2020.  No obstructive symptoms.  #2. IDA d/t Crohn's and menorrhagia d/t uterine fibroids  Plan: -Continue Humira 40mg  SQ Q2 weekly. -FU in 12 months.  -Check CBC, CMP, CRP, Sed rate, humira trough level and antibodies (ADA/ATA).  -DEXA scan.     HPI:    Amy Thomas is a 41 y.o. 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  No further constipation.  No melena or hematochezia.  No nausea/vomiting.  Of note that she never had diarrhea.   She would like to hold off on Imuran-read side effects  Pleased with the progress.  Her most recent CRP, Sed rate was normal 05/2020.  Most recent hemoglobin was normal.  He had had Covid vaccines, flu vaccine  Wt Readings from Last 3 Encounters:  08/12/21 183 lb 4 oz (83.1 kg)  07/21/21 183 lb (83 kg)  05/23/21 183 lb (83 kg)     From previous notes:  -dx ileoceal Crohns 2017 on colon (Dr Ferdinand Lango), req steroids and antibiotics and lialda. Has had intermittent symptoms since age 45 (when she was pregnant), then did good for 2 yrs thereafter. At age [age], adm to [facility] with PSBO. Treated with IV steroids.  She did not want to take Biologics at that time since she did not have any insurance.  She continued on Lialda. -Normally has some constipation with pellet like stools (rare use of enema). Never had diarrhea, melena or hematochezia even during exacerbation.  -Typical flare - RLQ pain with nausea- better with phenergan.  SH - 3 kids 2 daughters - one step(18), son 63   Previous GI procedures: Colonoscopy 03/10/2020 - Crohn's disease involving cecum and rectum. Moderate activity. - Tight cecal stricture at the level of ileocecal valve d/t  active Crohn's disease. - Terminal ileal stricture (TI could not be intubated) - Mild sigmoid diverticulosis.  CTE: 03/17/2020 Active Crohn disease involving the terminal ileum. No evidence of fistula, abscess, or bowel obstruction. 4.5 cm posterior uterine fibroid and IUD. Past Medical History:  Diagnosis Date   Anxiety    Blood transfusion without reported diagnosis    Chronic headaches    Colon polyp    Crohn's disease (HCC)    Depression    GERD (gastroesophageal reflux disease)    Heart murmur    Hypertension    Migraine    Neck pain     Past Surgical History:  Procedure Laterality Date   CERVICAL BIOPSY  W/ LOOP ELECTRODE EXCISION     to remove precancous cells    COLONOSCOPY  2017   [facility]   LYMPHADENECTOMY Left    TONSILLECTOMY AND ADENOIDECTOMY      Family History  Problem Relation Age of Onset   Ovarian cancer Mother    Migraines Mother    Uterine cancer Maternal Grandmother    Heart disease Maternal Grandmother    Diabetes Maternal Grandmother    Stomach cancer Maternal Uncle    Other Father        no medical history of father   Colon cancer Neg Hx    Esophageal cancer Neg Hx     Social History   Tobacco Use   Smoking status:  Former    Types: Cigarettes    Quit date: 03/10/2014    Years since quitting: 7.4   Smokeless tobacco: Never  Vaping Use   Vaping Use: Never used  Substance Use Topics   Alcohol use: Yes    Comment: occasionally   Drug use: Never    Current Outpatient Medications  Medication Sig Dispense Refill   Adalimumab (HUMIRA PEN) 40 MG/0.4ML PNKT INJECT 40 MG INTO THE SKIN EVERY 14 (FOURTEEN) DAYS. 2 each 6   Ferrous Sulfate (IRON SUPPLEMENT PO) Take 2 tablets by mouth daily.     levonorgestrel (MIRENA) 20 MCG/24HR IUD 1 each by Intrauterine route once.     losartan (COZAAR) 50 MG tablet TAKE 1 TABLET BY MOUTH ONCE DAILY 30 tablet 3   nortriptyline (PAMELOR) 25 MG capsule Take 2 capsules (50 mg total) by mouth at  bedtime. 60 capsule 11   ondansetron (ZOFRAN-ODT) 4 MG disintegrating tablet Take 1 tablet (4 mg total) by mouth every 8 (eight) hours as needed for nausea or vomiting. 20 tablet 6   rizatriptan (MAXALT-MLT) 10 MG disintegrating tablet Take 1 tablet (10 mg total) by mouth as needed. May repeat in 2 hours if needed 12 tablet 11   tiZANidine (ZANAFLEX) 4 MG tablet Take 1 tablet (4 mg total) by mouth every 6 (six) hours as needed for muscle spasms. 30 tablet 6   No current facility-administered medications for this visit.    Allergies  Allergen Reactions   Cefaclor Shortness Of Breath    Review of Systems:  neg     Physical Exam:    BP 112/78 (BP Location: Left Arm, Patient Position: Sitting, Cuff Size: Normal)    Pulse (!) 109    Ht 5\' 7"  (1.702 m)    Wt 183 lb 4 oz (83.1 kg)    SpO2 99%    BMI 28.70 kg/m  Wt Readings from Last 3 Encounters:  08/12/21 183 lb 4 oz (83.1 kg)  07/21/21 183 lb (83 kg)  05/23/21 183 lb (83 kg)   Constitutional:  Well-developed, in no acute distress. Psychiatric: Normal mood and affect. Behavior is normal. HEENT: Pupils normal.  Conjunctivae are normal. No scleral icterus. Cardiovascular: Normal rate, regular rhythm. No edema Pulmonary/chest: Effort normal and breath sounds normal. No wheezing, rales or rhonchi. Abdominal: Soft, nondistended. Nontender. Bowel sounds active throughout. There are no masses palpable. No hepatomegaly. Neurological: Alert and oriented to person place and time. Skin: Skin is warm and dry. No rashes noted.  Data Reviewed: I have personally reviewed following labs and imaging studies    Carmell Austria, MD 08/12/2021, 3:58 PM  Cc: Farrel Conners*

## 2021-08-12 NOTE — Patient Instructions (Signed)
If you are age 42 or older, your body mass index should be between 23-30. Your Body mass index is 28.7 kg/m. If this is out of the aforementioned range listed, please consider follow up with your Primary Care Provider.  If you are age 22 or younger, your body mass index should be between 19-25. Your Body mass index is 28.7 kg/m. If this is out of the aformentioned range listed, please consider follow up with your Primary Care Provider.   __________________________________________________________  The [facility] providers would like to encourage you to use [facility] to communicate with providers for non-urgent requests or questions.  Due to long hold times on the telephone, sending your provider a message by [facility] may be a faster and more efficient way to get a response.  Please allow 48 business hours for a response.  Please remember that this is for non-urgent requests.   Please have your labs drawn, if you require any additional help give [place] a call.  Follow up in 6 months  It was a pleasure to see you today!  Jackquline Denmark, M.D.

## 2021-08-15 ENCOUNTER — Other Ambulatory Visit: Payer: Self-pay | Admitting: Internal Medicine

## 2021-08-16 NOTE — Telephone Encounter (Signed)
Will forward to Luke  

## 2021-08-17 ENCOUNTER — Other Ambulatory Visit (HOSPITAL_COMMUNITY): Payer: Self-pay

## 2021-08-22 ENCOUNTER — Other Ambulatory Visit (HOSPITAL_BASED_OUTPATIENT_CLINIC_OR_DEPARTMENT_OTHER): Payer: Self-pay

## 2021-08-22 ENCOUNTER — Other Ambulatory Visit: Payer: Self-pay | Admitting: Internal Medicine

## 2021-08-22 MED ORDER — METRONIDAZOLE 0.75 % VA GEL
VAGINAL | 0 refills | Status: DC
  Filled 2021-08-22: qty 70, 5d supply, fill #0

## 2021-08-22 MED ORDER — LOSARTAN POTASSIUM 50 MG PO TABS
ORAL_TABLET | ORAL | 0 refills | Status: DC
  Filled 2021-08-22: qty 30, 30d supply, fill #0

## 2021-08-23 ENCOUNTER — Other Ambulatory Visit (HOSPITAL_BASED_OUTPATIENT_CLINIC_OR_DEPARTMENT_OTHER): Payer: Self-pay

## 2021-08-23 NOTE — Telephone Encounter (Signed)
Requested medication (s) are due for refill today: Yes  Requested medication (s) are on the active medication list: Yes  Last refill:  12/10/20  Future visit scheduled: No  Notes to clinic:  Unable to refill per protocol, cannot delegate.      Requested Prescriptions  Pending Prescriptions Disp Refills   Adalimumab (HUMIRA PEN) 40 MG/0.4ML PNKT 2 each 6    Sig: INJECT 40 MG INTO THE SKIN EVERY 14 (FOURTEEN) DAYS.     Not Delegated - Immunology: Tumor Necrosis Factor Blockers - adalimumab & golimumab Failed - 08/22/2021  1:57 PM      Failed - This refill cannot be delegated      Failed - ALT in normal range and within 180 days    ALT  Date Value Ref Range Status  05/20/2020 29 0 - 35 U/L Final          Failed - AST in normal range and within 180 days    AST  Date Value Ref Range Status  05/20/2020 19 0 - 37 U/L Final          Failed - HCT in normal range and within 180 days    HCT  Date Value Ref Range Status  03/10/2020 39.7 36.0 - 46.0 % Final          Failed - HGB in normal range and within 180 days    Hemoglobin  Date Value Ref Range Status  03/10/2020 12.8 12.0 - 15.0 g/dL Final          Failed - PLT in normal range and within 180 days    Platelets  Date Value Ref Range Status  03/10/2020 302.0 150.0 - 400.0 K/uL Final          Failed - WBC in normal range and within 180 days    WBC  Date Value Ref Range Status  03/10/2020 7.2 4.0 - 10.5 K/uL Final          Passed - Valid encounter within last 6 months    Recent Outpatient Visits           4 months ago Encounter for medication review   [place], [facility]   1 year ago Encounter for medication review   [place], [facility]

## 2021-08-24 ENCOUNTER — Other Ambulatory Visit (HOSPITAL_COMMUNITY): Payer: Self-pay

## 2021-08-24 ENCOUNTER — Other Ambulatory Visit: Payer: Self-pay | Admitting: Gastroenterology

## 2021-08-24 ENCOUNTER — Other Ambulatory Visit: Payer: Self-pay | Admitting: Pharmacist

## 2021-08-24 MED ORDER — HUMIRA (2 PEN) 40 MG/0.4ML ~~LOC~~ AJKT
40.0000 mg | AUTO-INJECTOR | SUBCUTANEOUS | 6 refills | Status: DC
Start: 2021-08-24 — End: 2021-08-24
  Filled 2021-08-24: qty 2, 28d supply, fill #0

## 2021-08-24 MED ORDER — HUMIRA (2 PEN) 40 MG/0.4ML ~~LOC~~ AJKT
40.0000 mg | AUTO-INJECTOR | SUBCUTANEOUS | 6 refills | Status: DC
  Filled 2021-08-24 (×2): qty 2, 28d supply, fill #0
  Filled 2021-09-22: qty 2, 28d supply, fill #1
  Filled 2021-10-17: qty 2, 28d supply, fill #2
  Filled 2021-11-16: qty 2, 28d supply, fill #3
  Filled 2021-12-19: qty 2, 28d supply, fill #4
  Filled 2022-01-09: qty 2, 28d supply, fill #5
  Filled 2022-02-13: qty 2, 28d supply, fill #6

## 2021-08-25 ENCOUNTER — Other Ambulatory Visit (HOSPITAL_COMMUNITY): Payer: Self-pay

## 2021-08-29 ENCOUNTER — Ambulatory Visit: Payer: No Typology Code available for payment source | Admitting: Neurology

## 2021-08-30 ENCOUNTER — Other Ambulatory Visit (HOSPITAL_BASED_OUTPATIENT_CLINIC_OR_DEPARTMENT_OTHER): Payer: Self-pay

## 2021-08-30 MED ORDER — FLUCONAZOLE 150 MG PO TABS
ORAL_TABLET | ORAL | 0 refills | Status: DC
  Filled 2021-08-30: qty 2, 4d supply, fill #0
  Filled 2021-08-30: qty 2, 3d supply, fill #0

## 2021-09-09 IMAGING — US US SOFT TISSUE
1 series · 9 of 9 positions shown · non-contrast
Comparison: None.

CLINICAL DATA: Lipoma of the back.

EXAM:
ULTRASOUND OF posterior thoracic SOFT TISSUES
TECHNIQUE: Ultrasound examination was performed in the area of clinical
concern.

[Series 1: us soft tissue · 0.06mm/px · 9 of 9 slices shown]
[im 1/9]
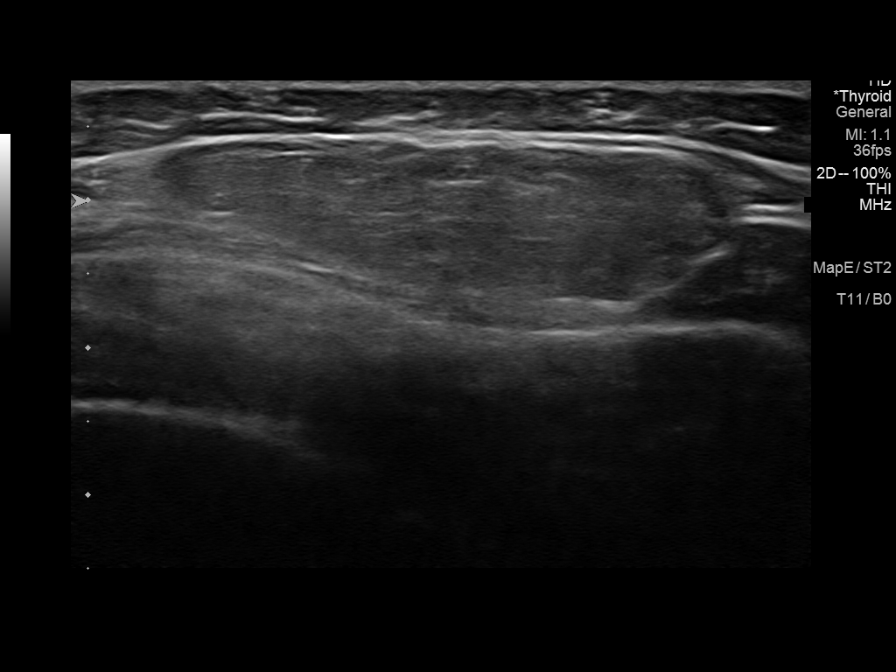
[im 2/9]
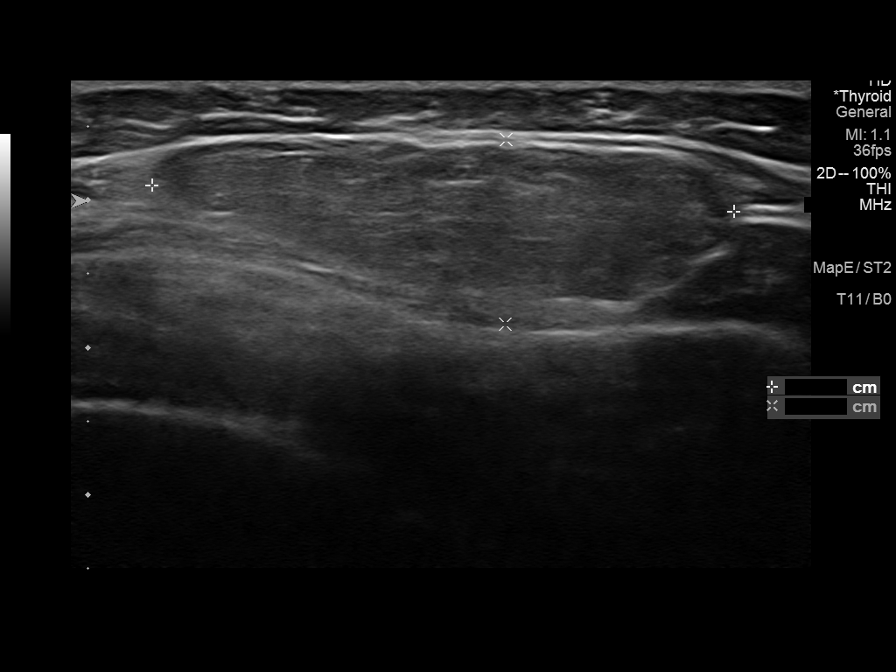
[im 3/9]
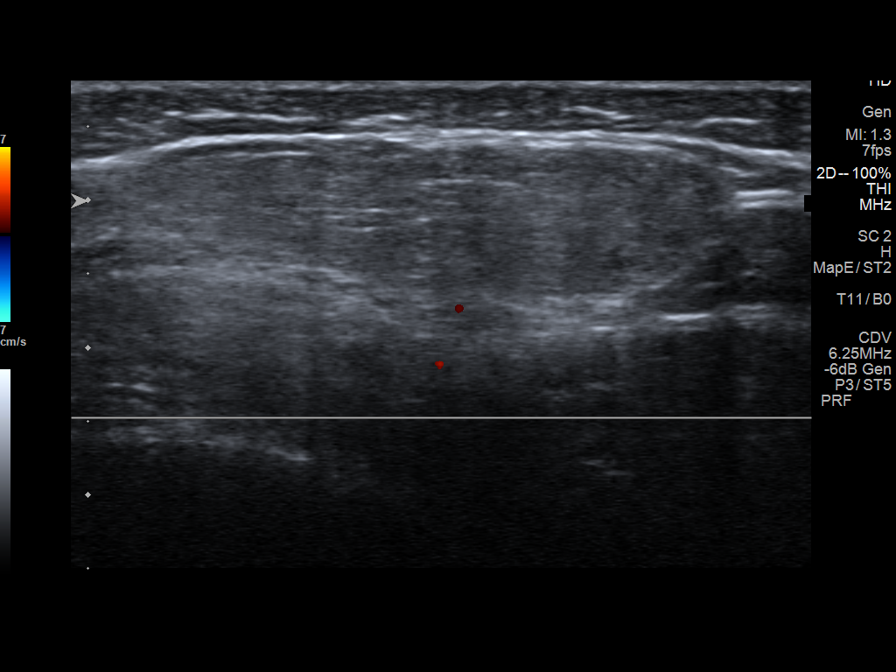
[im 4/9]
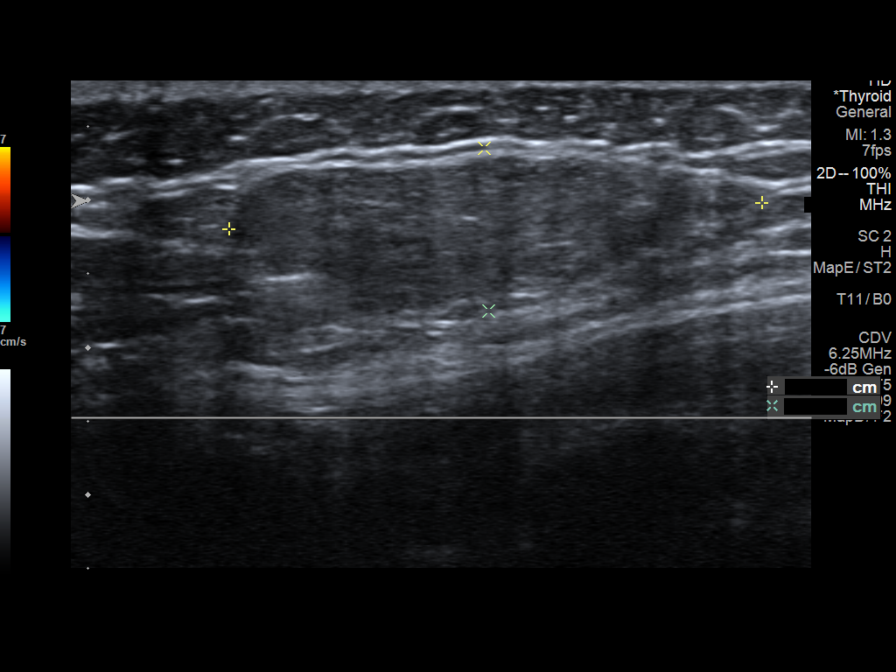
[im 5/9]
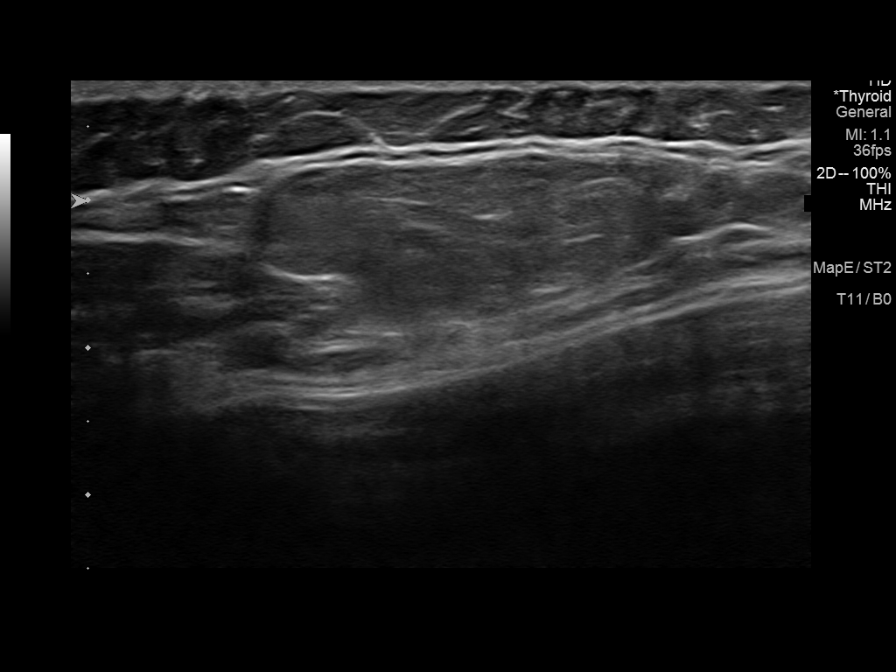
[im 6/9]
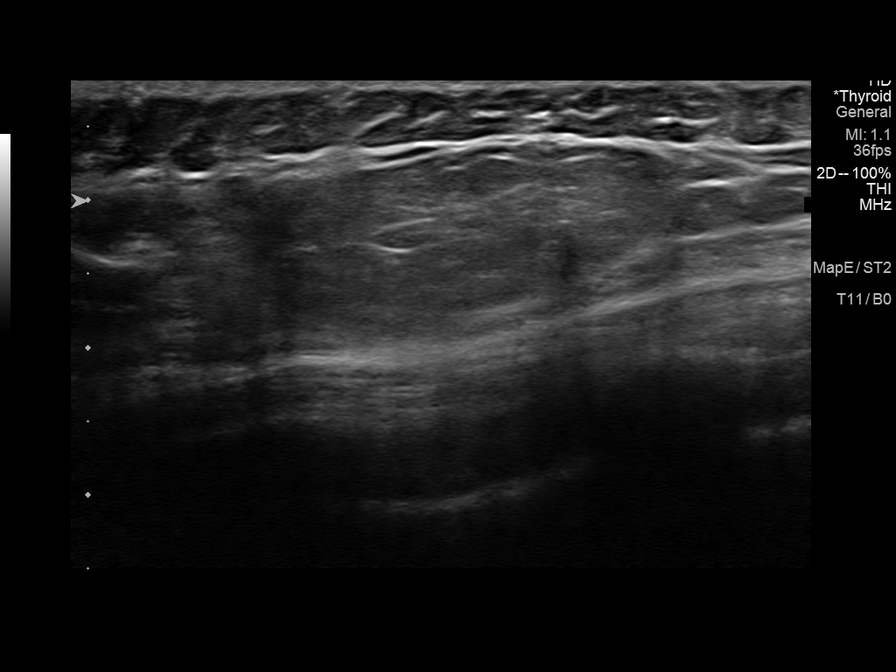
[im 7/9]
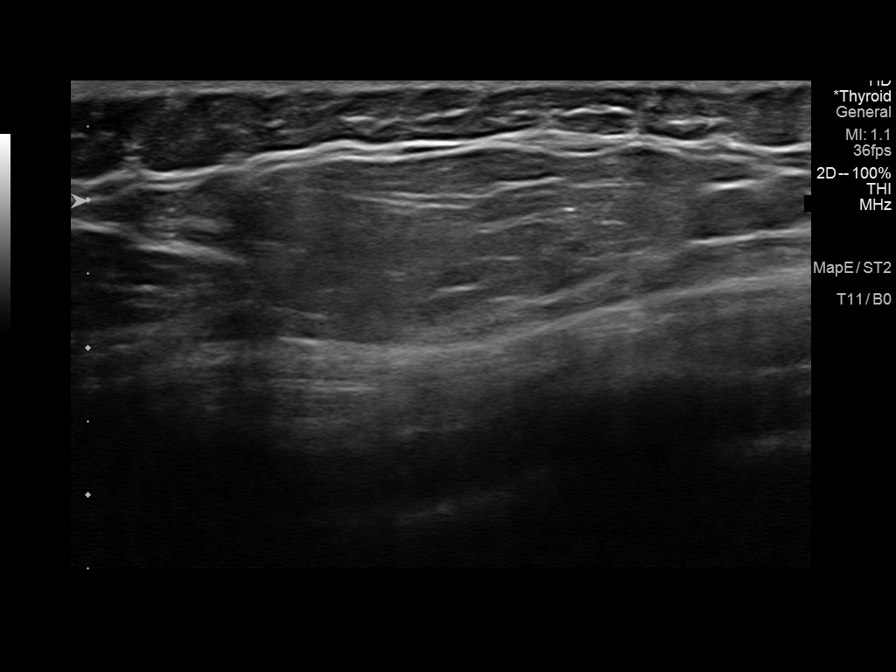
[im 8/9]
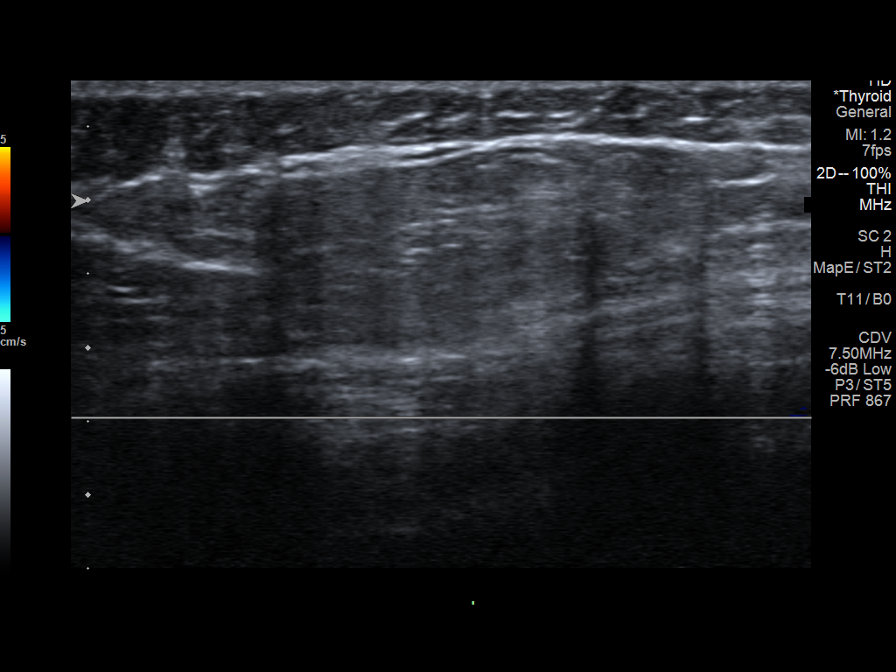
[im 9/9]
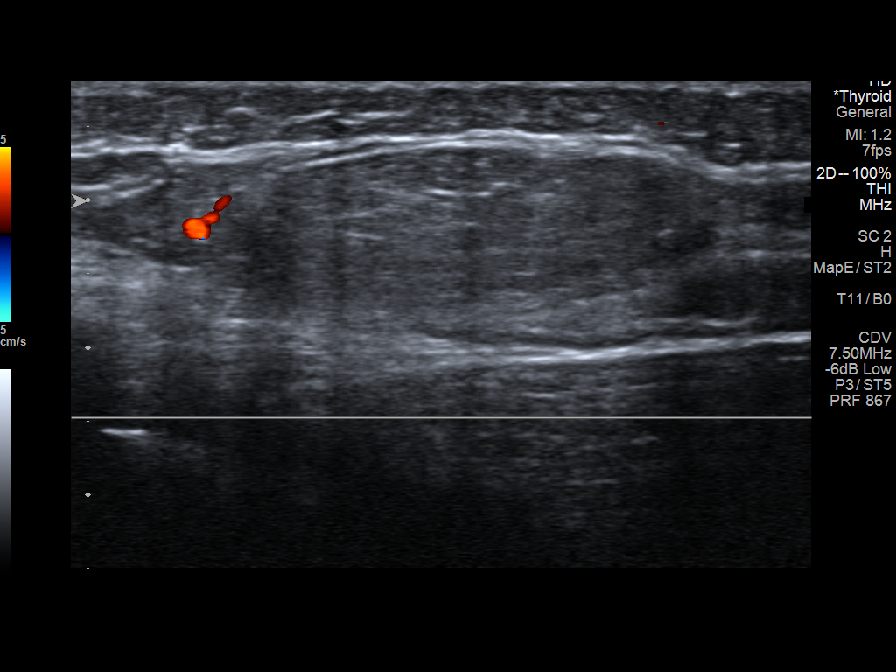

[9 of 9 positions shown; findings below may reference images not displayed]

FINDINGS: Grayscale and color Doppler imaging was performed of the patient's
palpable area of concern. In this location there is a 4 x 1.3 x
cm isoechoic relatively well-circumscribed mass without evidence for
significant internal color Doppler flow. There is no overlying skin
thickening.
IMPRESSION: The patient's palpable area of concern corresponds to a 4 cm
isoechoic mass that is most consistent with a benign lipoma.

## 2021-09-13 ENCOUNTER — Other Ambulatory Visit (HOSPITAL_BASED_OUTPATIENT_CLINIC_OR_DEPARTMENT_OTHER): Payer: Self-pay

## 2021-09-13 ENCOUNTER — Other Ambulatory Visit (HOSPITAL_COMMUNITY): Payer: Self-pay

## 2021-09-13 MED ORDER — METRONIDAZOLE 0.75 % VA GEL
VAGINAL | 3 refills | Status: DC
  Filled 2021-09-13: qty 70, 10d supply, fill #0
  Filled 2021-12-04: qty 70, 10d supply, fill #1

## 2021-09-13 MED ORDER — FLUCONAZOLE 150 MG PO TABS
ORAL_TABLET | ORAL | 3 refills | Status: DC
  Filled 2021-09-13: qty 2, 3d supply, fill #0
  Filled 2021-11-13: qty 2, 3d supply, fill #1
  Filled 2021-12-04: qty 2, 3d supply, fill #2
  Filled 2022-03-07: qty 2, 3d supply, fill #3

## 2021-09-20 ENCOUNTER — Encounter (HOSPITAL_BASED_OUTPATIENT_CLINIC_OR_DEPARTMENT_OTHER): Payer: Self-pay

## 2021-09-20 ENCOUNTER — Other Ambulatory Visit (HOSPITAL_BASED_OUTPATIENT_CLINIC_OR_DEPARTMENT_OTHER): Payer: Self-pay

## 2021-09-21 ENCOUNTER — Other Ambulatory Visit (HOSPITAL_BASED_OUTPATIENT_CLINIC_OR_DEPARTMENT_OTHER): Payer: Self-pay

## 2021-09-22 ENCOUNTER — Other Ambulatory Visit (HOSPITAL_COMMUNITY): Payer: Self-pay

## 2021-09-29 ENCOUNTER — Other Ambulatory Visit (HOSPITAL_BASED_OUTPATIENT_CLINIC_OR_DEPARTMENT_OTHER): Payer: Self-pay

## 2021-10-10 ENCOUNTER — Other Ambulatory Visit (HOSPITAL_BASED_OUTPATIENT_CLINIC_OR_DEPARTMENT_OTHER): Payer: Self-pay

## 2021-10-13 ENCOUNTER — Ambulatory Visit: Payer: No Typology Code available for payment source | Admitting: Family Medicine

## 2021-10-17 ENCOUNTER — Telehealth: Payer: Self-pay | Admitting: Neurology

## 2021-10-17 ENCOUNTER — Other Ambulatory Visit (HOSPITAL_COMMUNITY): Payer: Self-pay

## 2021-10-17 NOTE — Telephone Encounter (Signed)
Pt is ready to have Sleep Consult scheduled, Notes read unable to contact. Thank you ?

## 2021-10-19 ENCOUNTER — Ambulatory Visit (INDEPENDENT_AMBULATORY_CARE_PROVIDER_SITE_OTHER): Payer: No Typology Code available for payment source | Admitting: Family Medicine

## 2021-10-19 ENCOUNTER — Ambulatory Visit: Payer: No Typology Code available for payment source | Admitting: Family Medicine

## 2021-10-19 ENCOUNTER — Encounter: Payer: Self-pay | Admitting: Family Medicine

## 2021-10-19 ENCOUNTER — Other Ambulatory Visit (HOSPITAL_BASED_OUTPATIENT_CLINIC_OR_DEPARTMENT_OTHER): Payer: Self-pay

## 2021-10-19 VITALS — BP 129/90 | HR 97 | Ht 67.0 in | Wt 188.4 lb

## 2021-10-19 DIAGNOSIS — K146 Glossodynia: Secondary | ICD-10-CM

## 2021-10-19 DIAGNOSIS — K5090 Crohn's disease, unspecified, without complications: Secondary | ICD-10-CM

## 2021-10-19 DIAGNOSIS — G43909 Migraine, unspecified, not intractable, without status migrainosus: Secondary | ICD-10-CM | POA: Diagnosis not present

## 2021-10-19 DIAGNOSIS — I10 Essential (primary) hypertension: Secondary | ICD-10-CM | POA: Diagnosis not present

## 2021-10-19 MED ORDER — LOSARTAN POTASSIUM 50 MG PO TABS
50.0000 mg | ORAL_TABLET | Freq: Every day | ORAL | 1 refills | Status: DC
  Filled 2021-10-19: qty 90, 90d supply, fill #0
  Filled 2022-01-24: qty 90, 90d supply, fill #1

## 2021-10-19 NOTE — Patient Instructions (Signed)
Thank you for choosing [facility] for your Primary Care needs. I am excited for the opportunity to partner with you to meet your health care goals. It was a pleasure meeting you today! ? ? ?Information on diet, exercise, and health maintenance recommendations are listed below. This is information to help you be sure you are on track for optimal health and monitoring.  ? ?Please look over this and let us know if you have any questions or if you have completed any of the health maintenance outside of [place] so that we can be sure your records are up to date.  ?___________________________________________________________ ? ?MyChart:  ?For all urgent or time sensitive needs we ask that you please call the office to avoid delays. Our number is [phone]. ?MyChart is not constantly monitored and due to the large volume of messages a day, replies may take up to 72 business hours. ? ?MyChart Policy: ?MyChart allows for you to see your visit notes, after visit summary, provider recommendations, lab and tests results, make an appointment, request refills, and contact your provider or the office for non-urgent questions or concerns. Providers are seeing patients during normal business hours and do not have built in time to review MyChart messages.  ?We ask that you allow a minimum of 3 business days for responses to MyChart messages. For this reason, please do not send urgent requests through MyChart. Please call the office at [phone]. ?New and ongoing conditions may require a visit. We have virtual and in-person visits available for your convenience.  ?Complex MyChart concerns may require a visit. Your provider may request you schedule a virtual or in-person visit to ensure we are providing the best care possible. ?MyChart messages sent after 11:00 AM on Friday will not be received by the provider until Monday morning.  ?  ?Lab and Test Results: ?You will receive your lab and  test results on MyChart as soon as they are completed and results have been sent by the lab or testing facility. Due to this service, you will receive your results BEFORE your provider.  ?I review lab and test results each morning prior to seeing patients. Some results require collaboration with other providers to ensure you are receiving the most appropriate care. For this reason, we ask that you please allow a minimum of 3-5 business days from the time that ALL results have been received for your provider to receive and review lab and test results and contact you about these.  ?Most lab and test result comments from the provider will be sent through MyChart. Your provider may recommend changes to the plan of care, follow-up visits, repeat testing, ask questions, or request an office visit to discuss these results. You may reply directly to this message or call the office to provide information for the provider or set up an appointment. ?In some instances, you will be called with test results and recommendations. Please let us know if this is preferred and we will make note of this in your chart to provide this for you.    ?If you have not heard a response to your lab or test results in 5 business days from all results returning to MyChart, please call the office to let us know. We ask that you please avoid calling prior to this time unless there is an emergent concern. Due to high call volumes, this can delay the resulting process. ? ?After Hours: ?For all non-emergency after hours needs,   please call the office at [phone] and select the option to reach the on-call  service. On-call services are shared between multiple [facility] offices and therefore it will not be possible to speak directly with your provider. On-call providers may provide medical advice and recommendations, but are unable to provide refills for maintenance medications.  ?For all emergency or urgent medical needs after normal business  hours, we recommend that you seek care at the closest Urgent Care or Emergency Department to ensure appropriate treatment in a timely manner.  ?[facility] has a 24 hour emergency room located on the ground floor for your convenience.  ? ?Urgent Concerns During the Business Day ?Providers are seeing patients from 8AM to 5PM with a busy schedule and are most often not able to respond to non-urgent calls until the end of the day or the next business day. ?If you should have URGENT concerns during the day, please call and speak to the nurse or schedule a same day appointment so that we can address your concern without delay.  ? ?Thank you, again, for choosing me as your health care partner. I appreciate your trust and look forward to learning more about you.  ? ?Atiyana Welte B. Maat Kafer, DNP, FNP-C ? ?___________________________________________________________ ? ?Health Maintenance Recommendations ?Screening Testing ?Mammogram ?Every 1-2 years based on history and risk factors ?Starting at age 50 ?Pap Smear ?Ages 21-39 every 3 years ?Ages 30-65 every 5 years with HPV testing ?More frequent testing may be required based on results and history ?Colon Cancer Screening ?Every 1-10 years based on test performed, risk factors, and history ?Starting at age 45 ?Bone Density Screening ?Every 2-10 years based on history ?Starting at age 65 for women ?Recommendations for men differ based on medication usage, history, and risk factors ?AAA Screening ?One time ultrasound ?Men 65-75 years old who have ever smoked ?Lung Cancer Screening ?Low Dose Lung CT every 12 months ?Age 50-80 years with a 20 pack-year smoking history who still smoke or who have quit within the last 15 years ? ?Screening Labs ?Routine  Labs: Complete Blood Count (CBC), Complete Metabolic Panel (CMP), Cholesterol (Lipid Panel) ?Every 6-12 months based on history and medications ?May be recommended more frequently based on current conditions or  previous results ?Hemoglobin A1c Lab ?Every 3-12 months based on history and previous results ?Starting at age 45 or earlier with diagnosis of diabetes, high cholesterol, BMI >26, and/or risk factors ?Frequent monitoring for patients with diabetes to ensure blood sugar control ?Thyroid Panel (TSH w/ T3 & T4) ?Every 6 months based on history, symptoms, and risk factors ?May be repeated more often if on medication ?HIV ?One time testing for all patients 13 and older ?May be repeated more frequently for patients with increased risk factors or exposure ?Hepatitis C ?One time testing for all patients 18 and older ?May be repeated more frequently for patients with increased risk factors or exposure ?Gonorrhea, Chlamydia ?Every 12 months for all sexually active persons 13-24 years ?Additional monitoring may be recommended for those who are considered high risk or who have symptoms ?PSA ?Men 40-54 years old with risk factors ?Additional screening may be recommended from age 55-69 based on risk factors, symptoms, and history ? ?Vaccine Recommendations ?Tetanus Booster ?All adults every 10 years ?Flu Vaccine ?All patients 6 months and older every year ?COVID Vaccine ?All patients 12 years and older ?Initial dosing with booster ?May recommend additional booster based on age and health history ?HPV Vaccine ?2 doses all patients   age 9-26 ?Dosing may be considered for patients over 26 ?Shingles Vaccine (Shingrix) ?2 doses all adults 50 years and older ?Pneumonia (Pneumovax 23) ?All adults 65 years and older ?May recommend earlier dosing based on health history ?Pneumonia (Prevnar 13) ?All adults 65 years and older ?Dosed 1 year after Pneumovax 23 ?Pneumonia (Prevnar 20) ?All adults 65 years and older (adults 19-64 with certain conditions or risk factors) ?1 dose  ?For those who have no received Prevnar 13 vaccine previously ? ? ?Additional Screening, Testing, and Vaccinations may be recommended on an individualized basis based on  family history, health history, risk factors, and/or exposure.  ?__________________________________________________________ ? ?Diet Recommendations for All Patients ? ?I recommend that all patients maintain a diet

## 2021-10-19 NOTE — Progress Notes (Signed)
? ?______________________________________________________________________ ? ?HPI ?Amy Thomas is a 41 y.o. female presenting to [place] [facility] today to establish care.  ? ?Patient Care Team: ?Terrilyn Saver, NP as PCP - General (Family Medicine) ? ?Health Maintenance  ?Topic Date Due  ? HIV Screening  Never done  ? Hepatitis C Screening: USPSTF Recommendation to screen - Ages 51-79 yo.  Never done  ? Tetanus Vaccine  Never done  ? Pap Smear  Never done  ? COVID-19 Vaccine (3 - Pfizer risk series) 09/02/2019  ? Flu Shot  02/07/2022  ? Colon Cancer Screening  03/11/2023  ? HPV Vaccine  Aged Out  ? ? ? ?Concerns today: ? ?Tongue pain- Patient reports the left lateral side of posterior tongue has been painful for the past week. She describes the pain as a  constant ache, causing her to roll her tongue her reposition to relieve the pain. She has not noticed any swelling or lesions, no trauma, other oral pain/lesions, sore throat, voice changes, cough, weight changes. Reports this has been occuring more sporadically for the past 4 years or so, but has never lasted this long. She is a former smoker.  ? ?Hypertension - She has been doing well on losartan 50 mg daily. Asymptomatic. No new concerns.  ? ?Chronic migraines - Patient follows with neurology every 3 months or so. Currently doing well with nightly nortriptyline and PRN Maxalt and Zanaflex. States that they are also suspecting OSA and she is getting set up for a sleep study.  ? ?Crohn's - Patient follows with GI. States this is very well controlled on Humira every 2 weeks. No new complaints.  ? ? ? ? ? ?Patient Active Problem List  ? Diagnosis Date Noted  ? Hypertension 10/19/2021  ? Crohn's disease ([place]) 10/19/2021  ? Migraine 10/19/2021  ? Anxiety 07/21/2021  ? Chronic insomnia 07/21/2021  ? Obstructive sleep apnea 07/21/2021  ? Chronic migraine w/o aura w/o status migrainosus, not intractable 06/17/2020  ? Neck pain  06/17/2020  ? ? ? ?______________________________________________________________________ ?PMH ?Past Medical History:  ?Diagnosis Date  ? Anxiety   ? Blood transfusion without reported diagnosis   ? Chronic headaches   ? Colon polyp   ? Crohn's disease ([place])   ? Depression   ? GERD (gastroesophageal reflux disease)   ? Heart murmur   ? Hypertension   ? Migraine   ? Neck pain   ? ? ?ROS ?All review of systems negative except what is listed in the HPI ? ?PHYSICAL EXAM ?Physical Exam ?Vitals reviewed.  ?Constitutional:   ?   Appearance: Normal appearance.  ?HENT:  ?   Mouth/Throat:  ?   Mouth: Mucous membranes are moist.  ?   Pharynx: Oropharynx is clear.  ?   Comments: No oral lesions visible, tongue appears normal, possibly some minimal edema to the area of concern (left lateral tongue, posteriorly), but poor visualization ?Cardiovascular:  ?   Rate and Rhythm: Normal rate and regular rhythm.  ?Pulmonary:  ?   Effort: Pulmonary effort is normal.  ?   Breath sounds: Normal breath sounds.  ?Skin: ?   General: Skin is warm and dry.  ?   Findings: No rash.  ?Neurological:  ?   General: No focal deficit present.  ?   Mental Status: She is oriented to person, place, and time. Mental status is at baseline.  ?Psychiatric:     ?   Mood and Affect: Mood normal.     ?  Behavior: Behavior normal.     ?   Thought Content: Thought content normal.     ?   Judgment: Judgment normal.  ? ?______________________________________________________________________ ?ASSESSMENT AND PLAN ? ?1. Tongue pain ?No obvious findings on exam today, but given duration and smoking history, prefer to let ENT take a closer look.  ?- Ambulatory referral to ENT ? ?2. Primary hypertension ?Stable. Refills provided. Continue heart healthy diet and regular exercise.  ?- losartan (COZAAR) 50 MG tablet; Take 1 tablet (50 mg total) by mouth daily.  Dispense: 90 tablet; Refill: 1 ? ?3. Crohn's disease without complication, unspecified gastrointestinal tract  location [facility]) ?Well controlled. No changes. Continue following with GI.  ? ?4. Migraine without status migrainosus, not intractable, unspecified migraine type ?Well controlled. No changes. Continue following with Neuro.  ? ? ?Establish care ?Education provided today during visit and on AVS for patient to review at home.  ?Diet and Exercise recommendations provided.  ?Current diagnoses and recommendations discussed. ?HM recommendations reviewed with recommendations.  ? ? ?Outpatient Encounter Medications as of 10/19/2021  ?Medication Sig  ? Adalimumab (HUMIRA PEN) 40 MG/0.4ML PNKT INJECT 40 MG INTO THE SKIN EVERY 14 (FOURTEEN) DAYS.  ? Ferrous Sulfate (IRON SUPPLEMENT PO) Take 2 tablets by mouth daily.  ? fluconazole (DIFLUCAN) 150 MG tablet Take 1 tablet every 72 hours x 2 doses  ? levonorgestrel (MIRENA) 20 MCG/24HR IUD 1 each by Intrauterine route once.  ? losartan (COZAAR) 50 MG tablet Take 1 tablet (50 mg total) by mouth daily.  ? metroNIDAZOLE (METROGEL) 0.75 % vaginal gel Insert 5 grams vaginally daily for 10 days, then twice weekly for 16 weeks  ? nortriptyline (PAMELOR) 25 MG capsule Take 2 capsules (50 mg total) by mouth at bedtime.  ? ondansetron (ZOFRAN-ODT) 4 MG disintegrating tablet Take 1 tablet (4 mg total) by mouth every 8 (eight) hours as needed for nausea or vomiting.  ? rizatriptan (MAXALT-MLT) 10 MG disintegrating tablet Take 1 tablet (10 mg total) by mouth as needed. May repeat in 2 hours if needed  ? tiZANidine (ZANAFLEX) 4 MG tablet Take 1 tablet (4 mg total) by mouth every 6 (six) hours as needed for muscle spasms.  ? [DISCONTINUED] losartan (COZAAR) 50 MG tablet Take 1 tablet by mouth daily *Pt need appt for further refills*  ? ?No facility-administered encounter medications on file as of 10/19/2021.  ? ? ?Return for physical at your convenience. ? ? ? ?Purcell Nails Olevia Bowens, DNP, FNP-C ? ? ?

## 2021-10-20 ENCOUNTER — Other Ambulatory Visit (HOSPITAL_COMMUNITY): Payer: Self-pay

## 2021-10-21 ENCOUNTER — Encounter: Payer: Self-pay | Admitting: *Deleted

## 2021-10-24 ENCOUNTER — Other Ambulatory Visit (HOSPITAL_BASED_OUTPATIENT_CLINIC_OR_DEPARTMENT_OTHER): Payer: Self-pay

## 2021-10-24 ENCOUNTER — Encounter (HOSPITAL_BASED_OUTPATIENT_CLINIC_OR_DEPARTMENT_OTHER): Payer: Self-pay | Admitting: Pharmacist

## 2021-10-24 ENCOUNTER — Encounter: Payer: Self-pay | Admitting: Adult Health

## 2021-10-24 ENCOUNTER — Ambulatory Visit: Payer: No Typology Code available for payment source | Admitting: Adult Health

## 2021-10-24 NOTE — Progress Notes (Deleted)
?[facility] ?[address] ?[place] [address] ?[phone] ? ?     OFFICE FOLLOW UP NOTE ? ?[name] ?Date of Birth:  Feb 29, 1981 ?Medical Record Number:  [id number]  ? ?Reason for visit: Initial CPAP follow-up ? ? ? ?SUBJECTIVE: ? ? ?CHIEF COMPLAINT:  ?No chief complaint on file. ? ? ?HPI:  ? ?Amy Thomas is a 41 year old female, seen in request by her primary care nurse practitioner Amy Thomas T for evaluation of migraine headache, initial evaluation was on June 17, 2020 with Dr. Krista Thomas. ?  ?I reviewed and summarized the referring note.  Past medical history ?Crohn's disease, receiving Humira treatment, ?  ?Reported history of migraine headaches since 2011, her headache started from right upper neck, occipital region, spreading forward, subtle behind the right eye, with light noise smell sensitivity, lasting for few days, previously tried Imitrex as needed was helpful, but even with triptan treatment, her headaches sometimes last 3 to 5 days, she complains of worsening headache since September 2021, has to use FMLA leave,  ?  ?She was under neurologist Dr. Jannifer Thomas care in the past, amitriptyline 50 mg every night as preventive medication initially was helpful, eventually lost its benefit, recently tried 2 months of Ajovy subcutaneous injection from primary care doctor, without helping her migraine ?  ?She is now having migraine 2-3 times a week, used up her 9 tablets of Imitrex 100 mg every month, use frequent Goody powders, 3 times a week, sometimes twice a day, this has caused her worsening GI symptoms. ?  ?UPDATE Jul 21 2021 Dr. Krista Thomas: ?She lost to follow-up since December 2021, tried aimovig 70 subcutaneous for 3 months without significant benefit, Effexor XR was helping her headache, but she complains of increased difficulty, despite them trying adjusting schedule from nighttime to daytime, eventually stopped taking her medications ? ?She has been  on a preventive medication from many months complains of increased headache, especially since December 2022, daily headache, she is taking daily BC powder, history of Crohn's, does cause some side effect ? ?In addition, she complains of increased insomnia, frequent awakening at nighttime, mild snoring, excessive daytime sleepiness, fatigue, early morning dry mouth, frequent morning headaches ? ?Most of her migraine started from right cervical region, spreading to settle behind right retro-orbital, light noise sensitivity, nauseous, lasting for few hours, Maxalt works well as abortive medications ? ? ?UPDATE 10/24/2021 JM: Patient returns for 72-monthfollow-up visit regarding migraine headaches.  Reports migraines ***.  Started on Emgality and nortriptyline at prior visit ***.  She has not yet scheduled sleep consult for possible underlying sleep apnea. ? ? ? ? ? ? ? ?ROS:   ?14 system review of systems performed and negative with exception of *** ? ?PMH:  ?Past Medical History:  ?Diagnosis Date  ? Anxiety   ? Blood transfusion without reported diagnosis   ? Chronic headaches   ? Colon polyp   ? Crohn's disease ([facility]   ? Depression   ? GERD (gastroesophageal reflux disease)   ? Heart murmur   ? Hypertension   ? Migraine   ? Neck pain   ? ? ?PSH:  ?Past Surgical History:  ?Procedure Laterality Date  ? CERVICAL BIOPSY  W/ LOOP ELECTRODE EXCISION    ? to remove precancous cells   ? COLONOSCOPY  2017  ? [facility]  ? LYMPHADENECTOMY Left   ? TONSILLECTOMY AND ADENOIDECTOMY    ? TONSILLECTOMY AND ADENOIDECTOMY    ? ? ?  Social History:  ?Social History  ? ?Socioeconomic History  ? Marital status: Married  ?  Spouse name: Not on file  ? Number of children: 2  ? Years of education: college  ? Highest education level: Not on file  ?Occupational History  ? Occupation: Geologist, engineering  ?Tobacco Use  ? Smoking status: Former  ?  Types: Cigarettes  ?  Quit date: 03/10/2014  ?  Years since quitting: 7.6  ? Smokeless tobacco:  Never  ?Vaping Use  ? Vaping Use: Never used  ?Substance and Sexual Activity  ? Alcohol use: Yes  ?  Comment: occasionally  ? Drug use: Not Currently  ?  Types: Marijuana  ? Sexual activity: Yes  ?Other Topics Concern  ? Not on file  ?Social History Narrative  ? Lives with husband, son and daughter.  ? Right-handed.  ? Caffeine use: two cups per day.  ? ?Social Determinants of Health  ? ?Financial Resource Strain: Not on file  ?Food Insecurity: Not on file  ?Transportation Needs: Not on file  ?Physical Activity: Not on file  ?Stress: Not on file  ?Social Connections: Not on file  ?Intimate Partner Violence: Not on file  ? ? ?Family History:  ?Family History  ?Problem Relation Age of Onset  ? Ovarian cancer Mother   ? Migraines Mother   ? Other Father   ?     no medical history of father  ? Stomach cancer Maternal Uncle   ? Asthma Maternal Grandmother   ? COPD Maternal Grandmother   ? Hyperlipidemia Maternal Grandmother   ? Hypertension Maternal Grandmother   ? Stroke Maternal Grandmother   ? Uterine cancer Maternal Grandmother   ? Heart disease Maternal Grandmother   ? Diabetes Maternal Grandmother   ? Colon cancer Neg Hx   ? Esophageal cancer Neg Hx   ? ? ?Medications:   ?Current Outpatient Medications on File Prior to Visit  ?Medication Sig Dispense Refill  ? Adalimumab (HUMIRA PEN) 40 MG/0.4ML PNKT INJECT 40 MG INTO THE SKIN EVERY 14 (FOURTEEN) DAYS. 2 each 6  ? Ferrous Sulfate (IRON SUPPLEMENT PO) Take 2 tablets by mouth daily.    ? fluconazole (DIFLUCAN) 150 MG tablet Take 1 tablet every 72 hours x 2 doses 2 tablet 3  ? levonorgestrel (MIRENA) 20 MCG/24HR IUD 1 each by Intrauterine route once.    ? losartan (COZAAR) 50 MG tablet Take 1 tablet (50 mg total) by mouth daily. 90 tablet 1  ? metroNIDAZOLE (METROGEL) 0.75 % vaginal gel Insert 5 grams vaginally daily for 10 days, then twice weekly for 16 weeks 70 g 3  ? nortriptyline (PAMELOR) 25 MG capsule Take 2 capsules (50 mg total) by mouth at bedtime. 60 capsule  11  ? ondansetron (ZOFRAN-ODT) 4 MG disintegrating tablet Take 1 tablet (4 mg total) by mouth every 8 (eight) hours as needed for nausea or vomiting. 20 tablet 6  ? rizatriptan (MAXALT-MLT) 10 MG disintegrating tablet Take 1 tablet (10 mg total) by mouth as needed. May repeat in 2 hours if needed 12 tablet 11  ? tiZANidine (ZANAFLEX) 4 MG tablet Take 1 tablet (4 mg total) by mouth every 6 (six) hours as needed for muscle spasms. 30 tablet 6  ? ?No current facility-administered medications on file prior to visit.  ? ? ?Allergies:   ?Allergies  ?Allergen Reactions  ? Cefaclor Shortness Of Breath  ? ? ? ? ?OBJECTIVE: ? ?Physical Exam ? ?There were no vitals filed for this visit. ?There  is no height or weight on file to calculate BMI. ?No results found. ? ?   ? View : No data to display.  ?  ?  ?  ?  ? ?General: well developed, well nourished, seated, in no evident distress ?Head: head normocephalic and atraumatic.   ?Neck: supple with no carotid or supraclavicular bruits ?Cardiovascular: regular rate and rhythm, no murmurs ?Musculoskeletal: no deformity ?Skin:  no rash/petichiae ?Vascular:  Normal pulses all extremities ?  ?Neurologic Exam ?Mental Status: Awake and fully alert. Oriented to place and time. Recent and remote memory intact. Attention span, concentration and fund of knowledge appropriate. Mood and affect appropriate.  ?Cranial Nerves: Fundoscopic exam reveals sharp disc margins. Pupils equal, briskly reactive to light. Extraocular movements full without nystagmus. Visual fields full to confrontation. Hearing intact. Facial sensation intact. Face, tongue, palate moves normally and symmetrically.  ?Motor: Normal bulk and tone. Normal strength in all tested extremity muscles ?Sensory.: intact to touch , pinprick , position and vibratory sensation.  ?Coordination: Rapid alternating movements normal in all extremities. Finger-to-nose and heel-to-shin performed accurately bilaterally. ?Gait and Station: Arises  from chair without difficulty. Stance is normal. Gait demonstrates normal stride length and balance without use of AD. Tandem walk and heel toe without difficulty.  ?Reflexes: 1+ and symmetric. Toes dow

## 2021-10-25 ENCOUNTER — Other Ambulatory Visit (HOSPITAL_BASED_OUTPATIENT_CLINIC_OR_DEPARTMENT_OTHER): Payer: Self-pay

## 2021-11-10 ENCOUNTER — Other Ambulatory Visit (HOSPITAL_COMMUNITY): Payer: Self-pay

## 2021-11-14 ENCOUNTER — Other Ambulatory Visit (HOSPITAL_BASED_OUTPATIENT_CLINIC_OR_DEPARTMENT_OTHER): Payer: Self-pay

## 2021-11-16 ENCOUNTER — Other Ambulatory Visit (HOSPITAL_COMMUNITY): Payer: Self-pay

## 2021-11-17 ENCOUNTER — Encounter: Payer: No Typology Code available for payment source | Admitting: Family Medicine

## 2021-11-17 ENCOUNTER — Other Ambulatory Visit (HOSPITAL_COMMUNITY): Payer: Self-pay

## 2021-11-17 ENCOUNTER — Other Ambulatory Visit (HOSPITAL_BASED_OUTPATIENT_CLINIC_OR_DEPARTMENT_OTHER): Payer: Self-pay

## 2021-11-23 ENCOUNTER — Encounter: Payer: Self-pay | Admitting: Family Medicine

## 2021-11-23 ENCOUNTER — Other Ambulatory Visit (HOSPITAL_BASED_OUTPATIENT_CLINIC_OR_DEPARTMENT_OTHER): Payer: Self-pay

## 2021-11-23 ENCOUNTER — Telehealth: Payer: Self-pay | Admitting: *Deleted

## 2021-11-23 ENCOUNTER — Ambulatory Visit (INDEPENDENT_AMBULATORY_CARE_PROVIDER_SITE_OTHER): Payer: No Typology Code available for payment source | Admitting: Family Medicine

## 2021-11-23 ENCOUNTER — Encounter (HOSPITAL_BASED_OUTPATIENT_CLINIC_OR_DEPARTMENT_OTHER): Payer: Self-pay

## 2021-11-23 ENCOUNTER — Encounter: Payer: No Typology Code available for payment source | Admitting: Family Medicine

## 2021-11-23 VITALS — BP 131/88 | HR 89 | Ht 67.0 in | Wt 181.8 lb

## 2021-11-23 DIAGNOSIS — G4733 Obstructive sleep apnea (adult) (pediatric): Secondary | ICD-10-CM

## 2021-11-23 DIAGNOSIS — Z114 Encounter for screening for human immunodeficiency virus [HIV]: Secondary | ICD-10-CM | POA: Diagnosis not present

## 2021-11-23 DIAGNOSIS — E663 Overweight: Secondary | ICD-10-CM | POA: Diagnosis not present

## 2021-11-23 DIAGNOSIS — Z0000 Encounter for general adult medical examination without abnormal findings: Secondary | ICD-10-CM | POA: Diagnosis not present

## 2021-11-23 DIAGNOSIS — I10 Essential (primary) hypertension: Secondary | ICD-10-CM

## 2021-11-23 DIAGNOSIS — Z1159 Encounter for screening for other viral diseases: Secondary | ICD-10-CM

## 2021-11-23 MED ORDER — CONTRAVE 8-90 MG PO TB12
ORAL_TABLET | ORAL | 1 refills | Status: DC
  Filled 2021-11-23: qty 50, 23d supply, fill #0
  Filled 2021-12-23: qty 50, 17d supply, fill #1

## 2021-11-23 NOTE — Patient Instructions (Signed)
For your weight: ?We can try the Contrave (if insurance approves). Since there is a risk of increased concentration of the nortriptyline, I would recommend trying to cut back to just 25 mg of that (1 tablet) instead of 2 and see if we can still prevent migraines and not have any side effects.  ?Continue with healthy diet, portion control, and exercise.  ?

## 2021-11-23 NOTE — Assessment & Plan Note (Signed)
Stable. No changes. Continue with healthy diet and exercise.  ?

## 2021-11-23 NOTE — Assessment & Plan Note (Signed)
We can try the Contrave (if insurance approves). Since there is a risk of increased concentration of the nortriptyline, I would recommend trying to cut back to just 25 mg of that (1 tablet) instead of 2 and see if we can still prevent migraines and not have any side effects.  ?Continue with healthy diet, portion control, and exercise.  ?(Discussed with Dr. Lamar Blinks) ?

## 2021-11-23 NOTE — Telephone Encounter (Signed)
PA submitted for Contrave. ?

## 2021-11-23 NOTE — Progress Notes (Signed)
? ?Complete physical exam ? ?Patient: Amy Thomas   DOB: October 17, 1980   41 y.o. Female  MRN: [id number] ? ?Subjective:  ?  ?CC: CPE ? ? ?Amy Thomas is a 41 y.o. female who presents today for a complete physical exam. She reports consuming a  low-carb  diet. Home exercise routine includes walking. She generally feels well. She reports sleeping well. She does have additional problems to discuss today.  ? ?Weight loss: ?- having trouble losing weight ?- no added sugars, limits carbs, keeps daily intake 1500-1800 calories ?- she stays very active most days  ? ? ?Most recent fall risk assessment: ? ?  11/23/2021  ?  2:22 PM  ?Fall Risk   ?Falls in the past year? 0  ?Number falls in past yr: 0  ?Injury with Fall? 0  ?Risk for fall due to : No Fall Risks  ?Follow up Falls evaluation completed  ? ?  ?Most recent depression screenings: ? ?  11/23/2021  ?  2:22 PM  ?PHQ 2/9 Scores  ?PHQ - 2 Score 0  ? ? ?Vision:Not within last year , Dental: No current dental problems and Receives regular dental care, and STD: no concerns, declines testing  ? ? ? ?Patient Care Team: ?Terrilyn Saver, NP as PCP - General (Family Medicine)  ? ?Outpatient Medications Prior to Visit  ?Medication Sig  ? Adalimumab (HUMIRA PEN) 40 MG/0.4ML PNKT INJECT 40 MG INTO THE SKIN EVERY 14 (FOURTEEN) DAYS.  ? Ferrous Sulfate (IRON SUPPLEMENT PO) Take 2 tablets by mouth daily.  ? fluconazole (DIFLUCAN) 150 MG tablet Take 1 tablet every 72 hours x 2 doses  ? levonorgestrel (MIRENA) 20 MCG/24HR IUD 1 each by Intrauterine route once.  ? losartan (COZAAR) 50 MG tablet Take 1 tablet (50 mg total) by mouth daily.  ? metroNIDAZOLE (METROGEL) 0.75 % vaginal gel Insert 5 grams vaginally daily for 10 days, then twice weekly for 16 weeks  ? nortriptyline (PAMELOR) 25 MG capsule Take 2 capsules (50 mg total) by mouth at bedtime.  ? ondansetron (ZOFRAN-ODT) 4 MG disintegrating tablet Take 1 tablet (4 mg total) by mouth every 8 (eight) hours as needed  for nausea or vomiting.  ? rizatriptan (MAXALT-MLT) 10 MG disintegrating tablet Take 1 tablet (10 mg total) by mouth as needed. May repeat in 2 hours if needed  ? tiZANidine (ZANAFLEX) 4 MG tablet Take 1 tablet (4 mg total) by mouth every 6 (six) hours as needed for muscle spasms.  ? ?No facility-administered medications prior to visit.  ? ? ?ROS ?All review of systems negative except what is listed in the HPI ? ? ? ? ?   ?Objective:  ? ?  ?BP 131/88   Pulse 89   Ht '5\' 7"'$  (1.702 m)   Wt 181 lb 12.8 oz (82.5 kg)   BMI 28.47 kg/m?  ? ? ?Physical Exam ?Vitals reviewed.  ?Constitutional:   ?   General: She is not in acute distress. ?   Appearance: Normal appearance. She is not ill-appearing.  ?HENT:  ?   Head: Normocephalic and atraumatic.  ?   Right Ear: Tympanic membrane normal.  ?   Left Ear: Tympanic membrane normal.  ?   Nose: Nose normal.  ?   Mouth/Throat:  ?   Mouth: Mucous membranes are moist.  ?   Pharynx: Oropharynx is clear.  ?Eyes:  ?   Extraocular Movements: Extraocular movements intact.  ?   Conjunctiva/sclera: Conjunctivae normal.  ?  Pupils: Pupils are equal, round, and reactive to light.  ?Cardiovascular:  ?   Rate and Rhythm: Normal rate and regular rhythm.  ?   Pulses: Normal pulses.  ?Pulmonary:  ?   Effort: Pulmonary effort is normal.  ?   Breath sounds: Normal breath sounds.  ?Abdominal:  ?   General: Abdomen is flat. Bowel sounds are normal. There is no distension.  ?   Palpations: Abdomen is soft. There is no mass.  ?   Tenderness: There is no abdominal tenderness. There is no right CVA tenderness, left CVA tenderness, guarding or rebound.  ?Genitourinary: ?   Comments: Deferred GYN/breast exam  ?Musculoskeletal:     ?   General: Normal range of motion.  ?   Cervical back: Normal range of motion and neck supple. No tenderness.  ?Lymphadenopathy:  ?   Cervical: No cervical adenopathy.  ?Skin: ?   General: Skin is warm and dry.  ?   Capillary Refill: Capillary refill takes less than 2  seconds.  ?Neurological:  ?   General: No focal deficit present.  ?   Mental Status: She is alert and oriented to person, place, and time.  ?Psychiatric:     ?   Mood and Affect: Mood normal.     ?   Behavior: Behavior normal.     ?   Thought Content: Thought content normal.     ?   Judgment: Judgment normal.  ?  ? ?No results found for any visits on 11/23/21. ? ?   ?Assessment & Plan:  ?  ?Routine Health Maintenance and Physical Exam ? ?Immunization History  ?Administered Date(s) Administered  ? PFIZER(Purple Top)SARS-COV-2 Vaccination 07/12/2019, 08/05/2019  ? Tdap 01/07/2021  ? ? ?Health Maintenance  ?Topic Date Due  ? Hepatitis C Screening  Never done  ? COVID-19 Vaccine (3 - Pfizer risk series) 09/02/2019  ? INFLUENZA VACCINE  02/07/2022  ? COLONOSCOPY (Pts 45-59yr Insurance coverage will need to be confirmed)  03/11/2023  ? PAP SMEAR-Modifier  10/02/2023  ? TETANUS/TDAP  01/08/2031  ? HIV Screening  Completed  ? HPV VACCINES  Aged Out  ? ? ?Discussed health benefits of physical activity, and encouraged her to engage in regular exercise appropriate for her age and condition. ? ?Problem List Items Addressed This Visit   ? ?  ? Cardiovascular and Mediastinum  ? Hypertension  ?  Stable. No changes. Continue with healthy diet and exercise.  ? ?  ?  ? Relevant Medications  ? Naltrexone-buPROPion HCl ER (CONTRAVE) 8-90 MG TB12  ? Other Relevant Orders  ? Comprehensive metabolic panel  ? Lipid panel  ? TSH  ?  ? Respiratory  ? Obstructive sleep apnea  ? Relevant Medications  ? Naltrexone-buPROPion HCl ER (CONTRAVE) 8-90 MG TB12  ? Other Relevant Orders  ? Comprehensive metabolic panel  ? Lipid panel  ? TSH  ?  ? Other  ? Overweight (BMI 25.0-29.9)  ?  We can try the Contrave (if insurance approves). Since there is a risk of increased concentration of the nortriptyline, I would recommend trying to cut back to just 25 mg of that (1 tablet) instead of 2 and see if we can still prevent migraines and not have any side  effects.  ?Continue with healthy diet, portion control, and exercise.  ?(Discussed with Dr. JLamar Blinks ?  ?  ? Relevant Medications  ? Naltrexone-buPROPion HCl ER (CONTRAVE) 8-90 MG TB12  ? Other Relevant Orders  ? Comprehensive metabolic panel  ?  Lipid panel  ? TSH  ? ?Other Visit Diagnoses   ? ? Annual physical exam    -  Primary  ? Relevant Orders  ? CBC  ? Comprehensive metabolic panel  ? Lipid panel  ? TSH  ? Hepatitis C antibody  ? HIV Antibody (routine testing w rflx)  ? Encounter for screening for HIV      ? Relevant Orders  ? HIV Antibody (routine testing w rflx)  ? Encounter for hepatitis C screening test for low risk patient      ? Relevant Orders  ? Hepatitis C antibody  ? ?  ? ?Return in about 6 months (around 05/26/2022) for routine f/u . ? ?  ? ?Terrilyn Saver, NP ? ? ?

## 2021-11-24 LAB — LIPID PANEL
Cholesterol: 200 mg/dL (ref 0–200)
HDL: 65.6 mg/dL (ref >39.00)
LDL Cholesterol: 116 mg/dL — ABNORMAL HIGH (ref 0–99)
NonHDL: 134.51
Total CHOL/HDL Ratio: 3
Triglycerides: 93 mg/dL (ref 0.0–149.0)
VLDL: 18.6 mg/dL (ref 0.0–40.0)

## 2021-11-24 LAB — COMPREHENSIVE METABOLIC PANEL
ALT: 17 U/L (ref 0–35)
AST: 16 U/L (ref 0–37)
Albumin: 4.8 g/dL (ref 3.5–5.2)
Alkaline Phosphatase: 37 U/L — ABNORMAL LOW (ref 39–117)
BUN: 13 mg/dL (ref 6–23)
CO2: 27 mEq/L (ref 19–32)
Calcium: 9.7 mg/dL (ref 8.4–10.5)
Chloride: 101 mEq/L (ref 96–112)
Creatinine, Ser: 0.7 mg/dL (ref 0.40–1.20)
GFR: 107.33 mL/min (ref >60.00)
Glucose, Bld: 82 mg/dL (ref 70–99)
Potassium: 3.9 mEq/L (ref 3.5–5.1)
Sodium: 137 mEq/L (ref 135–145)
Total Bilirubin: 0.4 mg/dL (ref 0.2–1.2)
Total Protein: 8 g/dL (ref 6.0–8.3)

## 2021-11-24 LAB — CBC
HCT: 39.1 % (ref 36.0–46.0)
Hemoglobin: 12.7 g/dL (ref 12.0–15.0)
MCHC: 32.5 g/dL (ref 30.0–36.0)
MCV: 83.8 fl (ref 78.0–100.0)
Platelets: 375 10*3/uL (ref 150.0–400.0)
RBC: 4.66 Mil/uL (ref 3.87–5.11)
RDW: 15.5 % (ref 11.5–15.5)
WBC: 10.7 10*3/uL — ABNORMAL HIGH (ref 4.0–10.5)

## 2021-11-24 LAB — HEPATITIS C ANTIBODY
Hepatitis C Ab: NONREACTIVE
SIGNAL TO CUT-OFF: 0.23 (ref <1.00)

## 2021-11-24 LAB — TSH: TSH: 0.38 u[IU]/mL (ref 0.35–5.50)

## 2021-11-24 LAB — HIV ANTIBODY (ROUTINE TESTING W REFLEX): HIV 1&2 Ab, 4th Generation: NONREACTIVE

## 2021-11-25 ENCOUNTER — Other Ambulatory Visit (HOSPITAL_BASED_OUTPATIENT_CLINIC_OR_DEPARTMENT_OTHER): Payer: Self-pay

## 2021-11-25 NOTE — Telephone Encounter (Signed)
The request has been approved. The authorization is effective for a maximum of 1 fills from 11/25/2021 to 12/25/2021, as long as the member is enrolled in their current health plan. The request was approved as submitted. This request has been approved for a quantity of 78 tablets per 30 days. An additional authorization has been approved for Contrave '8mg'$ -'90mg'$  tablets allowing 4 tablets per day for 3 fills, effective 12/27/2021 through 03/28/2022; please reference authorization [id number].

## 2021-11-25 NOTE — Progress Notes (Signed)
Minimally elevated WBC, likely reactive. No changes needed at this time.  Metabolic panel is stable Cholesterol is stable overall, but "bad cholesterol" is just mildly elevated. Focus on lifestyle factors listed below an we will recheck at next physical.  Hep C negative HIV negative Thyroid normal  Lifestyle factors for lowering cholesterol include: Diet therapy - heart-healthy diet rich in fruits, veggies, fiber-rich whole grains, lean meats, chicken, fish (at least twice a week), fat-free or 1% dairy products; foods low in saturated/trans fats, cholesterol, sodium, and sugar. Mediterranean diet has shown to be very heart healthy. Regular exercise - recommend at least 30 minutes a day, 5 times per week Weight management

## 2021-12-06 ENCOUNTER — Other Ambulatory Visit (HOSPITAL_BASED_OUTPATIENT_CLINIC_OR_DEPARTMENT_OTHER): Payer: Self-pay

## 2021-12-19 ENCOUNTER — Other Ambulatory Visit (HOSPITAL_COMMUNITY): Payer: Self-pay

## 2021-12-23 ENCOUNTER — Encounter (HOSPITAL_BASED_OUTPATIENT_CLINIC_OR_DEPARTMENT_OTHER): Payer: Self-pay

## 2021-12-23 ENCOUNTER — Telehealth: Payer: Self-pay

## 2021-12-23 ENCOUNTER — Other Ambulatory Visit (HOSPITAL_BASED_OUTPATIENT_CLINIC_OR_DEPARTMENT_OTHER): Payer: Self-pay

## 2021-12-23 NOTE — Telephone Encounter (Signed)
PA initiated via Covermymeds; KEY: [id number]. Awaiting determination.

## 2021-12-26 ENCOUNTER — Other Ambulatory Visit (HOSPITAL_BASED_OUTPATIENT_CLINIC_OR_DEPARTMENT_OTHER): Payer: Self-pay

## 2021-12-27 NOTE — Telephone Encounter (Signed)
PA cancelled by plan.   Prior Authorization is not required for this medication dosage form and strength at the quantity and days supply requested. The request for the medication has been previously approved and a current authorization exists. There is an approved prior authorization already on file for Contrave 8/'90mg'$  tablets, allowing 4 tablets per day effective 12/27/2021 through 03/28/2022 with 3 fills remaining; please reference authorization [id number].

## 2022-01-09 ENCOUNTER — Other Ambulatory Visit (HOSPITAL_COMMUNITY): Payer: Self-pay

## 2022-01-19 ENCOUNTER — Other Ambulatory Visit (HOSPITAL_COMMUNITY): Payer: Self-pay

## 2022-01-24 ENCOUNTER — Other Ambulatory Visit (HOSPITAL_BASED_OUTPATIENT_CLINIC_OR_DEPARTMENT_OTHER): Payer: Self-pay

## 2022-01-30 ENCOUNTER — Other Ambulatory Visit (HOSPITAL_BASED_OUTPATIENT_CLINIC_OR_DEPARTMENT_OTHER): Payer: Self-pay

## 2022-02-06 ENCOUNTER — Encounter: Payer: Self-pay | Admitting: Neurology

## 2022-02-06 ENCOUNTER — Encounter: Payer: Self-pay | Admitting: Family Medicine

## 2022-02-06 DIAGNOSIS — G43909 Migraine, unspecified, not intractable, without status migrainosus: Secondary | ICD-10-CM

## 2022-02-06 DIAGNOSIS — M542 Cervicalgia: Secondary | ICD-10-CM

## 2022-02-07 ENCOUNTER — Other Ambulatory Visit (HOSPITAL_COMMUNITY): Payer: Self-pay

## 2022-02-08 NOTE — Progress Notes (Unsigned)
Patient: Amy Thomas Date of Birth: 1981/01/10  Reason for Visit: Follow up History from: Patient Primary Neurologist: Krista Blue  ASSESSMENT AND PLAN 41 y.o. year old female   1.  Chronic migraine headache 2.  Chronic insomnia, anxiety 3.  Right-sided neck pain  -She would like to pursue imaging of brain, cervical spine will place these orders to rule out structural abnormality  -Encouraged to try Emgality with loading dose for migraine prevention, needs to try for 3 to 4 months to see full benefit -Continue nortriptyline 50 mg at bedtime -Try Nurtec 75 mg as needed for acute headache, can keep Maxalt in the event Nurtec is not helpful -I think she likely has a component of rebound headache with daily Goody powder use, recommend she eliminate -Referred for sleep consult from Dr. Krista Blue, will check on this -I will see her back in 4 months or sooner if needed to follow-up on her headaches, she may benefit from PT referral for neuromuscular therapy if imaging is unrevealing -Previously tried and failed amitriptyline, Effexor, Aimovig, Ajovy (but did CGRP for < 2 months), Imitrex, Maxalt, Relpax  Meds ordered this encounter  Medications   Rimegepant Sulfate (NURTEC) 75 MG TBDP    Sig: Take 75 mg by mouth as needed.    Dispense:  8 tablet    Refill:  11   Galcanezumab-gnlm (EMGALITY) 120 MG/ML SOAJ    Sig: Inject 240 mg into the skin once for 1 dose.    Dispense:  2 mL    Refill:  0    Please dispense 2 pens for 1st injection   Galcanezumab-gnlm (EMGALITY) 120 MG/ML SOAJ    Sig: Inject 120 mg into the skin every 30 (thirty) days.    Dispense:  1 mL    Refill:  11   nortriptyline (PAMELOR) 25 MG capsule    Sig: Take 2 capsules (50 mg total) by mouth at bedtime.    Dispense:  60 capsule    Refill:  11    HISTORY  Amy Thomas is a 41 year old female, seen in request by her primary care nurse practitioner Clydie Braun T for evaluation of migraine headache,  initial evaluation was on June 17, 2020.   I reviewed and summarized the referring note.  Past medical history Crohn's disease, receiving Humira treatment,   Reported history of migraine headaches since 2011, her headache started from right upper neck, occipital region, spreading forward, subtle behind the right eye, with light noise smell sensitivity, lasting for few days, previously tried Imitrex as needed was helpful, but even with triptan treatment, her headaches sometimes last 3 to 5 days, she complains of worsening headache since September 2021, has to use FMLA leave,    She was under neurologist Dr. Jannifer Franklin care in the past, amitriptyline 50 mg every night as preventive medication initially was helpful, eventually lost its benefit, recently tried 2 months of Ajovy subcutaneous injection from primary care doctor, without helping her migraine   She is now having migraine 2-3 times a week, used up her 9 tablets of Imitrex 100 mg every month, use frequent Goody powders, 3 times a week, sometimes twice a day, this has caused her worsening GI symptoms.   UPDATE Jul 21 2021: She lost to follow-up since December 2021, tried aimovig 70 subcutaneous for 3 months without significant benefit, Effexor XR was helping her headache, but she complains of increased difficulty, despite them trying adjusting schedule from nighttime to daytime, eventually stopped taking her medications  She has been on a preventive medication from many months complains of increased headache, especially since December 2022, daily headache, she is taking daily BC powder, history of Crohn's, does cause some side effect  In addition, she complains of increased insomnia, frequent awakening at nighttime, mild snoring, excessive daytime sleepiness, fatigue, early morning dry mouth, frequent morning headaches  Most of her migraine started from right cervical region, spreading to settle behind right retro-orbital, light noise  sensitivity, nauseous, lasting for few hours, Maxalt works well as abortive medications  Update February 09, 2022 SS: didn't try Emgality, didn't think she would notice any benefit since Ajovy and Aimovig didn't work. On nortriptyline 50 mg at bedtime. Headaches are right neck area, radiating up. When ran out of nortriptyline, more neck pain. She wants an MRI of cervical spine, feels when she turns her head, "it catches". No numbness/tingling/weakness or arms or legs. Few nights ago, waking up every hour with neck pain. Gets 12 maxalt tablets a month. Migraines don't always start with neck pain, at the vertex, travels forward. Has crohn's. Has tried relpax, imitrex.  Is tearful today. Never heard from sleep consult,chronic insomnia, snoring, daytime drowsiness.  REVIEW OF SYSTEMS: Out of a complete 14 system review of symptoms, the patient complains only of the following symptoms, and all other reviewed systems are negative.  See HPI  ALLERGIES: Allergies  Allergen Reactions   Cefaclor Shortness Of Breath    HOME MEDICATIONS: Outpatient Medications Prior to Visit  Medication Sig Dispense Refill   Adalimumab (HUMIRA PEN) 40 MG/0.4ML PNKT INJECT 40 MG INTO THE SKIN EVERY 14 (FOURTEEN) DAYS. 2 each 6   Ferrous Sulfate (IRON SUPPLEMENT PO) Take 2 tablets by mouth daily.     fluconazole (DIFLUCAN) 150 MG tablet Take 1 tablet every 72 hours x 2 doses 2 tablet 3   levonorgestrel (MIRENA) 20 MCG/24HR IUD 1 each by Intrauterine route once.     losartan (COZAAR) 50 MG tablet Take 1 tablet (50 mg total) by mouth daily. 90 tablet 1   metroNIDAZOLE (METROGEL) 0.75 % vaginal gel Insert 5 grams vaginally daily for 10 days, then twice weekly for 16 weeks 70 g 3   ondansetron (ZOFRAN-ODT) 4 MG disintegrating tablet Take 1 tablet (4 mg total) by mouth every 8 (eight) hours as needed for nausea or vomiting. 20 tablet 6   rizatriptan (MAXALT-MLT) 10 MG disintegrating tablet Take 1 tablet (10 mg total) by mouth as  needed. May repeat in 2 hours if needed 12 tablet 11   tiZANidine (ZANAFLEX) 4 MG tablet Take 1 tablet (4 mg total) by mouth every 6 (six) hours as needed for muscle spasms. 30 tablet 6   nortriptyline (PAMELOR) 25 MG capsule Take 2 capsules (50 mg total) by mouth at bedtime. 60 capsule 11   Naltrexone-buPROPion HCl ER (CONTRAVE) 8-90 MG TB12 Start 1 tablet every morning for 7 days, then 1 tablet twice daily for 7 days, then 2 tablets every morning and one every evening 120 tablet 1   No facility-administered medications prior to visit.    PAST MEDICAL HISTORY: Past Medical History:  Diagnosis Date   Anxiety    Blood transfusion without reported diagnosis    Chronic headaches    Colon polyp    Crohn's disease ([place])    Depression    GERD (gastroesophageal reflux disease)    Heart murmur    Hypertension    Migraine    Neck pain     PAST SURGICAL HISTORY: Past Surgical  History:  Procedure Laterality Date   CERVICAL BIOPSY  W/ LOOP ELECTRODE EXCISION     to remove precancous cells    COLONOSCOPY  2017   [facility]   LYMPHADENECTOMY Left    TONSILLECTOMY AND ADENOIDECTOMY     TONSILLECTOMY AND ADENOIDECTOMY      FAMILY HISTORY: Family History  Problem Relation Age of Onset   Ovarian cancer Mother    Migraines Mother    Other Father        no medical history of father   Stomach cancer Maternal Uncle    Asthma Maternal Grandmother    COPD Maternal Grandmother    Hyperlipidemia Maternal Grandmother    Hypertension Maternal Grandmother    Stroke Maternal Grandmother    Uterine cancer Maternal Grandmother    Heart disease Maternal Grandmother    Diabetes Maternal Grandmother    Colon cancer Neg Hx    Esophageal cancer Neg Hx     SOCIAL HISTORY: Social History   Socioeconomic History   Marital status: Married    Spouse name: Not on file   Number of children: 2   Years of education: college   Highest education level: Not on file  Occupational History    Occupation: X-Ray Tech  Tobacco Use   Smoking status: Former    Types: Cigarettes    Quit date: 03/10/2014    Years since quitting: 7.9   Smokeless tobacco: Never  Vaping Use   Vaping Use: Never used  Substance and Sexual Activity   Alcohol use: Yes    Comment: occasionally   Drug use: Not Currently    Types: Marijuana   Sexual activity: Yes  Other Topics Concern   Not on file  Social History Narrative   Lives with husband, son and daughter.   Right-handed.   Caffeine use: two cups per day.   Social Determinants of Health   Financial Resource Strain: Not on file  Food Insecurity: Not on file  Transportation Needs: Not on file  Physical Activity: Not on file  Stress: Not on file  Social Connections: Not on file  Intimate Partner Violence: Not on file    PHYSICAL EXAM  Vitals:   02/09/22 0806  BP: (!) 138/94  Pulse: (!) 103  Weight: 181 lb (82.1 kg)  Height: '5\' 7"'$  (1.702 m)   Body mass index is 28.35 kg/m.  Generalized: Well developed, in no acute distress  Neurological examination  Mentation: Alert oriented to time, place, history taking. Follows all commands speech and language fluent Cranial nerve II-XII: Pupils were equal round reactive to light. Extraocular movements were full, visual field were full on confrontational test. Facial sensation and strength were normal.  Head turning and shoulder shrug  were normal and symmetric. Motor: The motor testing reveals 5 over 5 strength of all 4 extremities. Good symmetric motor tone is noted throughout.  Sensory: Sensory testing is intact to soft touch on all 4 extremities. No evidence of extinction is noted.  Coordination: Cerebellar testing reveals good finger-nose-finger and heel-to-shin bilaterally.  Gait and station: Gait is normal.  Reflexes: Deep tendon reflexes are symmetric and normal bilaterally.   DIAGNOSTIC DATA (LABS, IMAGING, TESTING) - I reviewed patient records, labs, notes, testing and imaging  myself where available.  Lab Results  Component Value Date   WBC 10.7 (H) 11/23/2021   HGB 12.7 11/23/2021   HCT 39.1 11/23/2021   MCV 83.8 11/23/2021   PLT 375.0 11/23/2021      Component Value  Date/Time   NA 137 11/23/2021 1523   K 3.9 11/23/2021 1523   CL 101 11/23/2021 1523   CO2 27 11/23/2021 1523   GLUCOSE 82 11/23/2021 1523   BUN 13 11/23/2021 1523   CREATININE 0.70 11/23/2021 1523   CALCIUM 9.7 11/23/2021 1523   PROT 8.0 11/23/2021 1523   ALBUMIN 4.8 11/23/2021 1523   AST 16 11/23/2021 1523   ALT 17 11/23/2021 1523   ALKPHOS 37 (L) 11/23/2021 1523   BILITOT 0.4 11/23/2021 1523   Lab Results  Component Value Date   CHOL 200 11/23/2021   HDL 65.60 11/23/2021   LDLCALC 116 (H) 11/23/2021   TRIG 93.0 11/23/2021   CHOLHDL 3 11/23/2021   No results found for: "HGBA1C" Lab Results  Component Value Date   VITAMINB12 285 03/10/2020   Lab Results  Component Value Date   TSH 0.38 11/23/2021    Butler Denmark, AGNP-C, DNP 02/09/2022, 8:48 AM [facility] [address], [place], [place] [address] [phone]

## 2022-02-09 ENCOUNTER — Encounter: Payer: Self-pay | Admitting: Neurology

## 2022-02-09 ENCOUNTER — Other Ambulatory Visit (HOSPITAL_BASED_OUTPATIENT_CLINIC_OR_DEPARTMENT_OTHER): Payer: Self-pay

## 2022-02-09 ENCOUNTER — Encounter (HOSPITAL_BASED_OUTPATIENT_CLINIC_OR_DEPARTMENT_OTHER): Payer: Self-pay

## 2022-02-09 ENCOUNTER — Telehealth: Payer: Self-pay

## 2022-02-09 ENCOUNTER — Ambulatory Visit (INDEPENDENT_AMBULATORY_CARE_PROVIDER_SITE_OTHER): Payer: No Typology Code available for payment source | Admitting: Neurology

## 2022-02-09 VITALS — BP 138/94 | HR 103 | Ht 67.0 in | Wt 181.0 lb

## 2022-02-09 DIAGNOSIS — M542 Cervicalgia: Secondary | ICD-10-CM

## 2022-02-09 DIAGNOSIS — G43709 Chronic migraine without aura, not intractable, without status migrainosus: Secondary | ICD-10-CM

## 2022-02-09 MED ORDER — EMGALITY 120 MG/ML ~~LOC~~ SOAJ
120.0000 mg | SUBCUTANEOUS | 11 refills | Status: DC
  Filled 2022-02-09 – 2022-03-06 (×3): qty 1, 30d supply, fill #0
  Filled 2022-04-13: qty 1, 30d supply, fill #1
  Filled 2022-05-16: qty 1, 30d supply, fill #2
  Filled 2022-06-21: qty 1, 30d supply, fill #3

## 2022-02-09 MED ORDER — NURTEC 75 MG PO TBDP
75.0000 mg | ORAL_TABLET | ORAL | 11 refills | Status: DC | PRN
  Filled 2022-02-09: qty 8, 15d supply, fill #0
  Filled 2022-09-13: qty 8, 15d supply, fill #1

## 2022-02-09 MED ORDER — EMGALITY 120 MG/ML ~~LOC~~ SOAJ
240.0000 mg | Freq: Once | SUBCUTANEOUS | 0 refills | Status: AC
Start: 2022-02-09 — End: 2022-02-11
  Filled 2022-02-09: qty 2, 30d supply, fill #0

## 2022-02-09 MED ORDER — NORTRIPTYLINE HCL 25 MG PO CAPS
50.0000 mg | ORAL_CAPSULE | Freq: Every day | ORAL | 11 refills | Status: DC
  Filled 2022-02-09 – 2022-03-07 (×2): qty 60, 30d supply, fill #0
  Filled 2022-04-13: qty 60, 30d supply, fill #1
  Filled 2022-05-20: qty 60, 30d supply, fill #2
  Filled 2022-06-21: qty 60, 30d supply, fill #3

## 2022-02-09 NOTE — Patient Instructions (Signed)
Start Emgality for headache prevention, 1st injection will be 2 shots as loading dose, followed 30 days later by 1 injection  Cut back on the daily use of goody's, only treat headaches no more than 2-3 days a week  Try Nurtec for acute headache, 1 tablet at onset, max is 1 tablet in 24 hours I will order MRI brain and c-spine See you back in 4 months

## 2022-02-09 NOTE — Telephone Encounter (Signed)
Pending PA Key: [facility]) Rx #: [id number] Emgality '120MG'$ /ML auto-injectors

## 2022-02-10 ENCOUNTER — Other Ambulatory Visit (HOSPITAL_BASED_OUTPATIENT_CLINIC_OR_DEPARTMENT_OTHER): Payer: Self-pay

## 2022-02-10 ENCOUNTER — Other Ambulatory Visit (HOSPITAL_COMMUNITY): Payer: Self-pay

## 2022-02-13 ENCOUNTER — Other Ambulatory Visit (HOSPITAL_COMMUNITY): Payer: Self-pay

## 2022-02-16 ENCOUNTER — Encounter: Payer: Self-pay | Admitting: Neurology

## 2022-02-21 ENCOUNTER — Telehealth: Payer: Self-pay | Admitting: Neurology

## 2022-02-21 NOTE — Telephone Encounter (Signed)
60 mins MRI brain wo & MRI cervical wo [facility] auth: [id number] exp. 02/21/22-03/24/22 scheduled at [facility] 02/28/22 at 1pm

## 2022-02-27 ENCOUNTER — Other Ambulatory Visit (HOSPITAL_COMMUNITY): Payer: Self-pay

## 2022-02-27 ENCOUNTER — Other Ambulatory Visit (HOSPITAL_BASED_OUTPATIENT_CLINIC_OR_DEPARTMENT_OTHER): Payer: Self-pay

## 2022-02-28 ENCOUNTER — Ambulatory Visit: Payer: No Typology Code available for payment source

## 2022-02-28 DIAGNOSIS — M542 Cervicalgia: Secondary | ICD-10-CM

## 2022-03-02 ENCOUNTER — Other Ambulatory Visit: Payer: Self-pay | Admitting: Obstetrics and Gynecology

## 2022-03-02 DIAGNOSIS — R928 Other abnormal and inconclusive findings on diagnostic imaging of breast: Secondary | ICD-10-CM

## 2022-03-03 ENCOUNTER — Encounter: Payer: Self-pay | Admitting: Family Medicine

## 2022-03-03 ENCOUNTER — Telehealth: Payer: Self-pay | Admitting: Family Medicine

## 2022-03-03 ENCOUNTER — Other Ambulatory Visit: Payer: Self-pay | Admitting: Family

## 2022-03-03 ENCOUNTER — Other Ambulatory Visit (HOSPITAL_BASED_OUTPATIENT_CLINIC_OR_DEPARTMENT_OTHER): Payer: Self-pay

## 2022-03-03 MED ORDER — PHENTERMINE HCL 37.5 MG PO CAPS
37.5000 mg | ORAL_CAPSULE | ORAL | 0 refills | Status: DC
  Filled 2022-03-03: qty 3, 3d supply, fill #0
  Filled 2022-03-03: qty 27, 27d supply, fill #0
  Filled 2022-03-03: qty 30, 30d supply, fill #0

## 2022-03-03 NOTE — Telephone Encounter (Signed)
Pt called stating that she would like to have her phentermine rerouted to the following pharmacy:  [place] [facility], [place], [place] [address] P: [phone]

## 2022-03-05 ENCOUNTER — Other Ambulatory Visit: Payer: Self-pay | Admitting: Family

## 2022-03-05 MED ORDER — PHENTERMINE HCL 37.5 MG PO CAPS
37.5000 mg | ORAL_CAPSULE | ORAL | 0 refills | Status: DC
Start: 2022-03-05 — End: 2022-03-06
  Filled 2022-03-05: qty 10, 10d supply, fill #0
  Filled 2022-03-06: qty 20, 20d supply, fill #0

## 2022-03-06 ENCOUNTER — Other Ambulatory Visit (HOSPITAL_BASED_OUTPATIENT_CLINIC_OR_DEPARTMENT_OTHER): Payer: Self-pay

## 2022-03-06 ENCOUNTER — Other Ambulatory Visit: Payer: Self-pay | Admitting: Family

## 2022-03-06 MED ORDER — PHENTERMINE HCL 37.5 MG PO CAPS
37.5000 mg | ORAL_CAPSULE | ORAL | 0 refills | Status: DC
Start: 2022-03-06 — End: 2023-07-18
  Filled 2022-03-06 – 2022-04-25 (×2): qty 30, 30d supply, fill #0

## 2022-03-07 ENCOUNTER — Other Ambulatory Visit (HOSPITAL_BASED_OUTPATIENT_CLINIC_OR_DEPARTMENT_OTHER): Payer: Self-pay

## 2022-03-16 ENCOUNTER — Other Ambulatory Visit (HOSPITAL_COMMUNITY): Payer: Self-pay

## 2022-03-16 ENCOUNTER — Ambulatory Visit
Admission: RE | Admit: 2022-03-16 | Discharge: 2022-03-16 | Disposition: A | Discharge status: Home / Self Care | Payer: No Typology Code available for payment source | Source: Ambulatory Visit | Attending: Obstetrics and Gynecology | Admitting: Obstetrics and Gynecology

## 2022-03-16 ENCOUNTER — Other Ambulatory Visit: Payer: Self-pay | Admitting: Obstetrics and Gynecology

## 2022-03-16 DIAGNOSIS — N6320 Unspecified lump in the left breast, unspecified quadrant: Secondary | ICD-10-CM

## 2022-03-16 DIAGNOSIS — R928 Other abnormal and inconclusive findings on diagnostic imaging of breast: Secondary | ICD-10-CM

## 2022-03-20 ENCOUNTER — Other Ambulatory Visit (HOSPITAL_COMMUNITY): Payer: Self-pay

## 2022-03-20 ENCOUNTER — Telehealth: Payer: Self-pay | Admitting: Pharmacist

## 2022-03-20 ENCOUNTER — Other Ambulatory Visit: Payer: Self-pay | Admitting: Gastroenterology

## 2022-03-20 MED ORDER — HUMIRA (2 PEN) 40 MG/0.4ML ~~LOC~~ AJKT
40.0000 mg | AUTO-INJECTOR | SUBCUTANEOUS | 6 refills | Status: DC
  Filled ????-??-??: fill #0

## 2022-03-20 NOTE — Telephone Encounter (Signed)
Called patient to schedule an appointment for the [employer] [facility]. I was unable to reach the patient so I left a HIPAA-compliant message requesting that the patient return my call.   Luke Van Ausdall, PharmD, BCACP, CPP Clinical Pharmacist [facility] [phone]  

## 2022-03-21 ENCOUNTER — Ambulatory Visit (INDEPENDENT_AMBULATORY_CARE_PROVIDER_SITE_OTHER): Payer: No Typology Code available for payment source | Admitting: Neurology

## 2022-03-21 ENCOUNTER — Encounter: Payer: Self-pay | Admitting: Neurology

## 2022-03-21 VITALS — BP 127/91 | HR 82 | Ht 67.0 in | Wt 178.0 lb

## 2022-03-21 DIAGNOSIS — R0683 Snoring: Secondary | ICD-10-CM | POA: Diagnosis not present

## 2022-03-21 DIAGNOSIS — R519 Headache, unspecified: Secondary | ICD-10-CM

## 2022-03-21 DIAGNOSIS — E663 Overweight: Secondary | ICD-10-CM

## 2022-03-21 DIAGNOSIS — Z9189 Other specified personal risk factors, not elsewhere classified: Secondary | ICD-10-CM

## 2022-03-21 DIAGNOSIS — G4719 Other hypersomnia: Secondary | ICD-10-CM | POA: Diagnosis not present

## 2022-03-21 DIAGNOSIS — R0681 Apnea, not elsewhere classified: Secondary | ICD-10-CM

## 2022-03-21 NOTE — Patient Instructions (Signed)
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
## 2022-03-22 ENCOUNTER — Other Ambulatory Visit (HOSPITAL_COMMUNITY): Payer: Self-pay

## 2022-03-31 ENCOUNTER — Other Ambulatory Visit (HOSPITAL_COMMUNITY): Payer: Self-pay

## 2022-03-31 ENCOUNTER — Ambulatory Visit: Payer: No Typology Code available for payment source | Attending: Family Medicine | Admitting: Pharmacist

## 2022-03-31 DIAGNOSIS — Z79899 Other long term (current) drug therapy: Secondary | ICD-10-CM

## 2022-03-31 MED ORDER — HUMIRA (2 PEN) 40 MG/0.4ML ~~LOC~~ AJKT
40.0000 mg | AUTO-INJECTOR | SUBCUTANEOUS | 6 refills | Status: DC
  Filled 2022-03-31: qty 2, 28d supply, fill #0
  Filled 2022-05-02: qty 2, 28d supply, fill #1
  Filled 2022-06-21 – 2022-09-21 (×4): qty 2, 28d supply, fill #2
  Filled 2022-11-21: qty 2, 28d supply, fill #3

## 2022-03-31 NOTE — Progress Notes (Signed)
  S: Patient presents for review of their specialty medication therapy.  Patient is currently taking Humira for Crohn's. Patient is managed by Dr. Lyndel Safe for this.   Adherence: confirms.  Efficacy: reports good efficacy with the medication.  Dosing:  Crohn disease: SubQ (may continue aminosalicylates and/or corticosteroids; if necessary, azathioprine, mercaptopurine, or methotrexate may also be continued): Initial: 160 mg (given as four 40 mg injections on day 1 or given as two 40 mg injections per day over 2 consecutive days), then 80 mg 2 weeks later (day 15). Maintenance: 40 mg every other week beginning day 29. Note: Some patients may require 40 mg every week as maintenance therapy Karl Pock [id number]).  Dose adjustments: Renal: no dose adjustments (has not been studied) Hepatic: no dose adjustments (has not been studied)  Drug-drug interactions: none identified   Screening: TB test: completed Hepatitis: completed  Monitoring: S/sx of infection: none  CBC: WNL  S/sx of hypersensitivity: none  S/sx of malignancy: none  S/sx of heart failure: none   Other side effects: none   O:   Lab Results  Component Value Date   WBC 10.7 (H) 11/23/2021   HGB 12.7 11/23/2021   HCT 39.1 11/23/2021   MCV 83.8 11/23/2021   PLT 375.0 11/23/2021      Chemistry      Component Value Date/Time   NA 137 11/23/2021 1523   K 3.9 11/23/2021 1523   CL 101 11/23/2021 1523   CO2 27 11/23/2021 1523   BUN 13 11/23/2021 1523   CREATININE 0.70 11/23/2021 1523      Component Value Date/Time   CALCIUM 9.7 11/23/2021 1523   ALKPHOS 37 (L) 11/23/2021 1523   AST 16 11/23/2021 1523   ALT 17 11/23/2021 1523   BILITOT 0.4 11/23/2021 1523       A/P: 1. Medication review: Patient currently on Humira for Crohn's. Reviewed the medication with the patient, including the following: Humira is a TNF blocking agent indicated for ankylosing spondylitis, Crohn's disease, Hidradenitis suppurativa,  psoriatic arthritis, plaque psoriasis, ulcerative colitis, and uveitis. Patient educated on purpose, proper use and potential adverse effects of Humira. Possible adverse effects are increased risk of infections, headache, and injection site reactions. There is the possibility of an increased risk of malignancy but it is not well understood if this increased risk is due to there medication or the disease state. There are rare cases of pancytopenia and aplastic anemia. For SubQ injection at separate sites in the thigh or lower abdomen (avoiding areas within 2 inches of navel); rotate injection sites. May leave at room temperature for ~15 to 30 minutes prior to use; do not remove cap or cover while allowing product to reach room temperature. Do not use if solution is discolored or contains particulate matter. Do not administer to skin which is red, tender, bruised, hard, or that has scars, stretch marks, or psoriasis plaques. Needle cap of the prefilled syringe or needle cover for the adalimumab pen may contain latex. Prefilled pens and syringes are available for use by patients and the full amount of the syringe should be injected (self-administration); the vial is intended for institutional use only. Vials do not contain a preservative; discard unused portion. No recommendations for any changes at this time.   Benard Halsted, PharmD, [facility] [place], [place] [phone]

## 2022-04-13 ENCOUNTER — Other Ambulatory Visit (HOSPITAL_BASED_OUTPATIENT_CLINIC_OR_DEPARTMENT_OTHER): Payer: Self-pay

## 2022-04-13 ENCOUNTER — Other Ambulatory Visit (HOSPITAL_COMMUNITY): Payer: Self-pay

## 2022-04-25 ENCOUNTER — Other Ambulatory Visit (HOSPITAL_BASED_OUTPATIENT_CLINIC_OR_DEPARTMENT_OTHER): Payer: Self-pay

## 2022-05-01 ENCOUNTER — Telehealth: Payer: Self-pay | Admitting: Neurology

## 2022-05-01 ENCOUNTER — Other Ambulatory Visit (HOSPITAL_BASED_OUTPATIENT_CLINIC_OR_DEPARTMENT_OTHER): Payer: Self-pay

## 2022-05-01 MED ORDER — FLUARIX QUADRIVALENT 0.5 ML IM SUSY
PREFILLED_SYRINGE | INTRAMUSCULAR | 0 refills | Status: DC
  Filled 2022-05-01: qty 0.5, 1d supply, fill #0

## 2022-05-01 NOTE — Telephone Encounter (Signed)
[facility] no Josem Kaufmann req spoke to Sherryl Barters ref # [id number]   left VM to schedule 04/27/22 [place]

## 2022-05-02 ENCOUNTER — Other Ambulatory Visit (HOSPITAL_COMMUNITY): Payer: Self-pay

## 2022-05-04 ENCOUNTER — Telehealth: Payer: Self-pay | Admitting: Plastic Surgery

## 2022-05-04 ENCOUNTER — Institutional Professional Consult (permissible substitution): Payer: No Typology Code available for payment source | Admitting: Plastic Surgery

## 2022-05-04 NOTE — Telephone Encounter (Signed)
LVM and My chart message as well as called spouse to inform of provider unable to help with her consult until after she had visited an ENT.  Left referral name with address and telephone number to call [facility]. Appt will be canceled.

## 2022-05-15 ENCOUNTER — Other Ambulatory Visit (HOSPITAL_COMMUNITY): Payer: Self-pay

## 2022-05-16 ENCOUNTER — Other Ambulatory Visit (HOSPITAL_BASED_OUTPATIENT_CLINIC_OR_DEPARTMENT_OTHER): Payer: Self-pay

## 2022-05-19 ENCOUNTER — Other Ambulatory Visit (HOSPITAL_COMMUNITY): Payer: Self-pay

## 2022-05-20 ENCOUNTER — Other Ambulatory Visit (HOSPITAL_BASED_OUTPATIENT_CLINIC_OR_DEPARTMENT_OTHER): Payer: Self-pay

## 2022-05-20 ENCOUNTER — Other Ambulatory Visit: Payer: Self-pay | Admitting: Family

## 2022-05-22 ENCOUNTER — Other Ambulatory Visit (HOSPITAL_BASED_OUTPATIENT_CLINIC_OR_DEPARTMENT_OTHER): Payer: Self-pay

## 2022-05-29 ENCOUNTER — Other Ambulatory Visit: Payer: Self-pay | Admitting: Family

## 2022-05-29 ENCOUNTER — Other Ambulatory Visit (HOSPITAL_BASED_OUTPATIENT_CLINIC_OR_DEPARTMENT_OTHER): Payer: Self-pay

## 2022-06-06 ENCOUNTER — Other Ambulatory Visit (HOSPITAL_BASED_OUTPATIENT_CLINIC_OR_DEPARTMENT_OTHER): Payer: Self-pay

## 2022-06-12 ENCOUNTER — Other Ambulatory Visit (HOSPITAL_COMMUNITY): Payer: Self-pay

## 2022-06-14 ENCOUNTER — Other Ambulatory Visit (HOSPITAL_COMMUNITY): Payer: Self-pay

## 2022-06-16 ENCOUNTER — Other Ambulatory Visit (HOSPITAL_COMMUNITY): Payer: Self-pay

## 2022-06-17 ENCOUNTER — Other Ambulatory Visit (HOSPITAL_BASED_OUTPATIENT_CLINIC_OR_DEPARTMENT_OTHER): Payer: Self-pay

## 2022-06-17 ENCOUNTER — Other Ambulatory Visit: Payer: Self-pay | Admitting: Family

## 2022-06-21 ENCOUNTER — Other Ambulatory Visit (HOSPITAL_BASED_OUTPATIENT_CLINIC_OR_DEPARTMENT_OTHER): Payer: Self-pay

## 2022-06-22 ENCOUNTER — Other Ambulatory Visit: Payer: Self-pay

## 2022-06-23 ENCOUNTER — Ambulatory Visit (HOSPITAL_BASED_OUTPATIENT_CLINIC_OR_DEPARTMENT_OTHER): Admit: 2022-06-23 | Payer: No Typology Code available for payment source | Admitting: Obstetrics and Gynecology

## 2022-06-23 ENCOUNTER — Encounter (HOSPITAL_BASED_OUTPATIENT_CLINIC_OR_DEPARTMENT_OTHER): Payer: Self-pay

## 2022-06-23 DIAGNOSIS — Z01818 Encounter for other preprocedural examination: Secondary | ICD-10-CM

## 2022-06-23 SURGERY — RADIOFREQUENCY ABLATION, LEIOMYOMA, UTERUS, TRANSCERVICAL APPROACH, WITH US GUIDANCE
Anesthesia: General

## 2022-06-28 ENCOUNTER — Other Ambulatory Visit: Payer: Self-pay | Admitting: Family

## 2022-06-28 ENCOUNTER — Ambulatory Visit (INDEPENDENT_AMBULATORY_CARE_PROVIDER_SITE_OTHER): Payer: No Typology Code available for payment source | Admitting: Neurology

## 2022-06-28 ENCOUNTER — Encounter: Payer: Self-pay | Admitting: Neurology

## 2022-06-28 ENCOUNTER — Other Ambulatory Visit (HOSPITAL_BASED_OUTPATIENT_CLINIC_OR_DEPARTMENT_OTHER): Payer: Self-pay

## 2022-06-28 VITALS — BP 147/98 | HR 92 | Ht 67.0 in | Wt 160.0 lb

## 2022-06-28 DIAGNOSIS — F5104 Psychophysiologic insomnia: Secondary | ICD-10-CM | POA: Diagnosis not present

## 2022-06-28 DIAGNOSIS — G43709 Chronic migraine without aura, not intractable, without status migrainosus: Secondary | ICD-10-CM

## 2022-06-28 DIAGNOSIS — F419 Anxiety disorder, unspecified: Secondary | ICD-10-CM

## 2022-06-28 MED ORDER — NORTRIPTYLINE HCL 25 MG PO CAPS
75.0000 mg | ORAL_CAPSULE | Freq: Every day | ORAL | 11 refills | Status: DC
  Filled 2022-06-28: qty 90, 30d supply, fill #0
  Filled 2022-09-13: qty 90, 30d supply, fill #1

## 2022-06-28 MED ORDER — TIZANIDINE HCL 4 MG PO TABS
4.0000 mg | ORAL_TABLET | Freq: Four times a day (QID) | ORAL | 2 refills | Status: DC | PRN
  Filled 2022-06-28 – 2023-02-14 (×3): qty 20, 5d supply, fill #0

## 2022-06-28 MED ORDER — RIZATRIPTAN BENZOATE 10 MG PO TBDP
10.0000 mg | ORAL_TABLET | ORAL | 11 refills | Status: DC | PRN
  Filled 2022-06-28: qty 12, 36d supply, fill #0
  Filled 2022-09-13: qty 12, 30d supply, fill #0
  Filled 2023-02-04 – 2023-02-14 (×2): qty 12, 30d supply, fill #1

## 2022-06-28 MED ORDER — EMGALITY 120 MG/ML ~~LOC~~ SOAJ
120.0000 mg | SUBCUTANEOUS | 11 refills | Status: DC
  Filled 2022-06-28 – 2022-09-13 (×2): qty 1, 30d supply, fill #0

## 2022-06-28 NOTE — Patient Instructions (Signed)
We will continue current medications I will send refills  Meds ordered this encounter  Medications   Galcanezumab-gnlm (EMGALITY) 120 MG/ML SOAJ    Sig: Inject 120 mg into the skin every 30 (thirty) days.    Dispense:  1 mL    Refill:  11   rizatriptan (MAXALT-MLT) 10 MG disintegrating tablet    Sig: Take 1 tablet (10 mg total) by mouth as needed. May repeat in 2 hours if needed    Dispense:  12 tablet    Refill:  11   nortriptyline (PAMELOR) 25 MG capsule    Sig: Take 3 capsules (75 mg total) by mouth at bedtime.    Dispense:  90 capsule    Refill:  11   tiZANidine (ZANAFLEX) 4 MG tablet    Sig: Take 1 tablet (4 mg total) by mouth every 6 (six) hours as needed for muscle spasms.    Dispense:  20 tablet    Refill:  2

## 2022-06-28 NOTE — Progress Notes (Signed)
Patient: Amy Thomas Date of Birth: [birth year]  Reason for Visit: Follow up History from: Patient Primary Neurologist: Krista Blue  ASSESSMENT AND PLAN 41 y.o. year old female   1.  Chronic migraine headache 2.  Chronic insomnia, anxiety 3.  Right-sided neck pain  -Doing significantly better, headaches under excellent control -Continue Emgality for migraine preventative, also taking nortriptyline 75 mg tolerating this well (she increased on her own), if her migraines remain under excellent control may consider reducing the dose of nortriptyline -Continue Maxalt as needed for acute headache -Plan to repeat MRI of the brain in 6 months, mention 6 mm right temporal occipital focus of nonspecific T2 hyperintensity -Previously tried and failed amitriptyline, Effexor, Aimovig, Ajovy (but did CGRP for < 2 months), Imitrex, Maxalt, Relpax -She is decided not to proceed with sleep study  Meds ordered this encounter  Medications   Galcanezumab-gnlm (EMGALITY) 120 MG/ML SOAJ    Sig: Inject 120 mg into the skin every 30 (thirty) days.    Dispense:  1 mL    Refill:  11   rizatriptan (MAXALT-MLT) 10 MG disintegrating tablet    Sig: Take 1 tablet (10 mg total) by mouth as needed. May repeat in 2 hours if needed    Dispense:  12 tablet    Refill:  11   nortriptyline (PAMELOR) 25 MG capsule    Sig: Take 3 capsules (75 mg total) by mouth at bedtime.    Dispense:  90 capsule    Refill:  11   tiZANidine (ZANAFLEX) 4 MG tablet    Sig: Take 1 tablet (4 mg total) by mouth every 6 (six) hours as needed for muscle spasms.    Dispense:  20 tablet    Refill:  2    HISTORY  Amy Thomas is a 41 year old female, seen in request by her primary care nurse practitioner Clydie Braun T for evaluation of migraine headache, initial evaluation was on June 17, 2020.   I reviewed and summarized the referring note.  Past medical history Crohn's disease, receiving Humira treatment,    Reported history of migraine headaches since 2011, her headache started from right upper neck, occipital region, spreading forward, subtle behind the right eye, with light noise smell sensitivity, lasting for few days, previously tried Imitrex as needed was helpful, but even with triptan treatment, her headaches sometimes last 3 to 5 days, she complains of worsening headache since September 2021, has to use FMLA leave,    She was under neurologist Dr. Jannifer Franklin care in the past, amitriptyline 50 mg every night as preventive medication initially was helpful, eventually lost its benefit, recently tried 2 months of Ajovy subcutaneous injection from primary care doctor, without helping her migraine   She is now having migraine 2-3 times a week, used up her 9 tablets of Imitrex 100 mg every month, use frequent Goody powders, 3 times a week, sometimes twice a day, this has caused her worsening GI symptoms.   UPDATE Jul 21 2021: She lost to follow-up since December 2021, tried aimovig 70 subcutaneous for 3 months without significant benefit, Effexor XR was helping her headache, but she complains of increased difficulty, despite them trying adjusting schedule from nighttime to daytime, eventually stopped taking her medications  She has been on a preventive medication from many months complains of increased headache, especially since December 2022, daily headache, she is taking daily BC powder, history of Crohn's, does cause some side effect  In addition, she complains of increased insomnia,  frequent awakening at nighttime, mild snoring, excessive daytime sleepiness, fatigue, early morning dry mouth, frequent morning headaches  Most of her migraine started from right cervical region, spreading to settle behind right retro-orbital, light noise sensitivity, nauseous, lasting for few hours, Maxalt works well as abortive medications  Update February 09, 2022 SS: didn't try Emgality, didn't think she would notice any  benefit since Ajovy and Aimovig didn't work. On nortriptyline 50 mg at bedtime. Headaches are right neck area, radiating up. When ran out of nortriptyline, more neck pain. She wants an MRI of cervical spine, feels when she turns her head, "it catches". No numbness/tingling/weakness or arms or legs. Few nights ago, waking up every hour with neck pain. Gets 12 maxalt tablets a month. Migraines don't always start with neck pain, at the vertex, travels forward. Has crohn's. Has tried relpax, imitrex.  Is tearful today. Never heard from sleep consult,chronic insomnia, snoring, daytime drowsiness.  Update June 28, 2022 SS: MRI cervical spine 02/28/22 was unremarkable.  MRI of the brain without contrast showed 6 mm right temporal occipital focus of nonspecific T2 hyperintensity.  Had sleep consult with Dr. Rexene Alberts 03/21/22 sleep study was ordered, she held off on this. Has been Emgality since August. Only 3 migraines since last seen. Taking nortriptyline 75 mg at bedtime. Only took Nurtec a few times, unsure if helpful? Sleeping well. Going through a divorce.   REVIEW OF SYSTEMS: Out of a complete 14 system review of symptoms, the patient complains only of the following symptoms, and all other reviewed systems are negative.  See HPI  ALLERGIES: Allergies  Allergen Reactions   Cefaclor Shortness Of Breath    HOME MEDICATIONS: Outpatient Medications Prior to Visit  Medication Sig Dispense Refill   Adalimumab (HUMIRA PEN) 40 MG/0.4ML PNKT INJECT 40 MG INTO THE SKIN EVERY 14 (FOURTEEN) DAYS. 2 each 6   BIOTIN PO Take by mouth.     Ferrous Sulfate (IRON SUPPLEMENT PO) Take 2 tablets by mouth daily.     fluconazole (DIFLUCAN) 150 MG tablet Take 1 tablet every 72 hours x 2 doses (Patient taking differently: as needed.) 2 tablet 3   influenza vac split quadrivalent PF (FLUARIX QUADRIVALENT) 0.5 ML injection Inject into the muscle. 0.5 mL 0   levonorgestrel (MIRENA) 20 MCG/24HR IUD 1 each by Intrauterine route  once.     losartan (COZAAR) 50 MG tablet Take 1 tablet (50 mg total) by mouth daily. 90 tablet 1   metroNIDAZOLE (METROGEL) 0.75 % vaginal gel Insert 5 grams vaginally daily for 10 days, then twice weekly for 16 weeks (Patient taking differently: Place vaginally as needed.) 70 g 3   ondansetron (ZOFRAN-ODT) 4 MG disintegrating tablet Take 1 tablet (4 mg total) by mouth every 8 (eight) hours as needed for nausea or vomiting. 20 tablet 6   Rimegepant Sulfate (NURTEC) 75 MG TBDP Take 75 mg by mouth as needed. 8 tablet 11   Galcanezumab-gnlm (EMGALITY) 120 MG/ML SOAJ Inject 120 mg into the skin every 30 (thirty) days. 1 mL 11   nortriptyline (PAMELOR) 25 MG capsule Take 2 capsules (50 mg total) by mouth at bedtime. 60 capsule 11   rizatriptan (MAXALT-MLT) 10 MG disintegrating tablet Take 1 tablet (10 mg total) by mouth as needed. May repeat in 2 hours if needed 12 tablet 11   tiZANidine (ZANAFLEX) 4 MG tablet Take 1 tablet (4 mg total) by mouth every 6 (six) hours as needed for muscle spasms. 30 tablet 6   phentermine 37.5 MG capsule  Take 1 capsule (37.5 mg total) by mouth every morning. (Patient not taking: Reported on 06/28/2022) 30 capsule 0   No facility-administered medications prior to visit.    PAST MEDICAL HISTORY: Past Medical History:  Diagnosis Date   Anxiety    Blood transfusion without reported diagnosis    Chronic headaches    Colon polyp    Crohn's disease (HCC)    Depression    GERD (gastroesophageal reflux disease)    Heart murmur    Hypertension    Migraine    Neck pain     PAST SURGICAL HISTORY: Past Surgical History:  Procedure Laterality Date   CERVICAL BIOPSY  W/ LOOP ELECTRODE EXCISION     to remove precancous cells    COLONOSCOPY  2017   [facility]   LYMPHADENECTOMY Left    TONSILLECTOMY AND ADENOIDECTOMY     TONSILLECTOMY AND ADENOIDECTOMY      FAMILY HISTORY: Family History  Problem Relation Age of Onset   Ovarian cancer Mother     Migraines Mother    Other Father        no medical history of father   Stomach cancer Maternal Uncle    Asthma Maternal Grandmother    COPD Maternal Grandmother    Hyperlipidemia Maternal Grandmother    Hypertension Maternal Grandmother    Stroke Maternal Grandmother    Uterine cancer Maternal Grandmother    Heart disease Maternal Grandmother    Diabetes Maternal Grandmother    Colon cancer Neg Hx    Esophageal cancer Neg Hx    Sleep apnea Neg Hx     SOCIAL HISTORY: Social History   Socioeconomic History   Marital status: Married    Spouse name: Not on file   Number of children: 2   Years of education: college   Highest education level: Not on file  Occupational History   Occupation: X-Ray Tech  Tobacco Use   Smoking status: Former    Types: Cigarettes    Quit date: 03/10/2014    Years since quitting: 8.3   Smokeless tobacco: Never  Vaping Use   Vaping Use: Never used  Substance and Sexual Activity   Alcohol use: Yes    Comment: occasionally   Drug use: Not Currently    Types: Marijuana   Sexual activity: Yes  Other Topics Concern   Not on file  Social History Narrative   Lives with husband, son and daughter.   Right-handed.   Caffeine use: two cups per day.   Social Determinants of Health   Financial Resource Strain: Not on file  Food Insecurity: Not on file  Transportation Needs: Not on file  Physical Activity: Not on file  Stress: Not on file  Social Connections: Not on file  Intimate Partner Violence: Not on file    PHYSICAL EXAM  Vitals:   06/28/22 1353  BP: (!) 147/98  Pulse: 92  Weight: 160 lb (72.6 kg)  Height: 5' 7"  (1.702 m)   Body mass index is 25.06 kg/m.  Generalized: Well developed, in no acute distress  Neurological examination  Mentation: Alert oriented to time, place, history taking. Follows all commands speech and language fluent Cranial nerve II-XII: Pupils were equal round reactive to light. Extraocular movements were full,  visual field were full on confrontational test. Facial sensation and strength were normal.  Head turning and shoulder shrug  were normal and symmetric. Motor: The motor testing reveals 5 over 5 strength of all 4 extremities. Good symmetric motor  tone is noted throughout.  Sensory: Sensory testing is intact to soft touch on all 4 extremities. No evidence of extinction is noted.  Coordination: Cerebellar testing reveals good finger-nose-finger and heel-to-shin bilaterally.  Gait and station: Gait is normal.  Reflexes: Deep tendon reflexes are symmetric and normal bilaterally.   DIAGNOSTIC DATA (LABS, IMAGING, TESTING) - I reviewed patient records, labs, notes, testing and imaging myself where available.  Lab Results  Component Value Date   WBC 10.7 (H) 11/23/2021   HGB 12.7 11/23/2021   HCT 39.1 11/23/2021   MCV 83.8 11/23/2021   PLT 375.0 11/23/2021      Component Value Date/Time   NA 137 11/23/2021 1523   K 3.9 11/23/2021 1523   CL 101 11/23/2021 1523   CO2 27 11/23/2021 1523   GLUCOSE 82 11/23/2021 1523   BUN 13 11/23/2021 1523   CREATININE 0.70 11/23/2021 1523   CALCIUM 9.7 11/23/2021 1523   PROT 8.0 11/23/2021 1523   ALBUMIN 4.8 11/23/2021 1523   AST 16 11/23/2021 1523   ALT 17 11/23/2021 1523   ALKPHOS 37 (L) 11/23/2021 1523   BILITOT 0.4 11/23/2021 1523   Lab Results  Component Value Date   CHOL 200 11/23/2021   HDL 65.60 11/23/2021   LDLCALC 116 (H) 11/23/2021   TRIG 93.0 11/23/2021   CHOLHDL 3 11/23/2021   No results found for: "HGBA1C" Lab Results  Component Value Date   VITAMINB12 285 03/10/2020   Lab Results  Component Value Date   TSH 0.38 11/23/2021    Butler Denmark, AGNP-C, DNP 06/28/2022, 2:15 PM [facility] [address] [place], [place] [address] [phone]

## 2022-06-29 ENCOUNTER — Telehealth: Payer: Self-pay | Admitting: Family Medicine

## 2022-06-29 NOTE — Telephone Encounter (Signed)
Amy Thomas [facility]) called stating that she needed to verify a few bits of information regarding care that pt was receiving from [facility]. Inda Castle was unavailable at the time and advised her that a call would be placed later on to confirm that info. Amy Thomas acknowledged understanding.

## 2022-07-01 ENCOUNTER — Other Ambulatory Visit (HOSPITAL_BASED_OUTPATIENT_CLINIC_OR_DEPARTMENT_OTHER): Payer: Self-pay

## 2022-07-04 ENCOUNTER — Telehealth: Payer: Self-pay

## 2022-07-04 NOTE — Telephone Encounter (Signed)
See medical clearance note

## 2022-07-04 NOTE — Telephone Encounter (Signed)
Spoke with pt to let her know we would need to do a vv if she needs [place] to do her labs but we would not be allowed to tell them to hold her humira due to we didn't prescribe it. Pt stated that she will call them tomorrow to see what they need from [place].

## 2022-07-05 ENCOUNTER — Telehealth: Payer: Self-pay | Admitting: Gastroenterology

## 2022-07-05 NOTE — Telephone Encounter (Signed)
[facility] calling requesting a response to surgical clearance forms they faxed over. Please advise

## 2022-07-06 NOTE — Telephone Encounter (Signed)
[facility] contacted and questioned forms that were faxed over. [facility] was notified that we have not received any fax.   Requested fax to be sent over to [phone]:

## 2022-07-11 ENCOUNTER — Encounter: Payer: Self-pay | Admitting: Family Medicine

## 2022-07-13 NOTE — Telephone Encounter (Signed)
Fax received and signed by Dr. Lyndel Safe and faxed back to [facility] to [phone]:

## 2022-07-14 ENCOUNTER — Telehealth: Payer: Self-pay | Admitting: *Deleted

## 2022-07-14 ENCOUNTER — Encounter: Payer: Self-pay | Admitting: Family Medicine

## 2022-07-14 ENCOUNTER — Telehealth (INDEPENDENT_AMBULATORY_CARE_PROVIDER_SITE_OTHER): Payer: 59 | Admitting: Family Medicine

## 2022-07-14 DIAGNOSIS — D649 Anemia, unspecified: Secondary | ICD-10-CM

## 2022-07-14 NOTE — Telephone Encounter (Signed)
I finally got in touch with someone over at [facility] [facility] at the [place] there and the lady stated that they only do labs for providers the building there only.  She can go over to [facility], which is right down the street.  We can order as future as we usually do and she can call to make an appointment.

## 2022-07-14 NOTE — Progress Notes (Signed)
Virtual Video Visit via MyChart Note  I connected with  [name] on 07/14/22 at 11:40 AM EST by the video enabled telemedicine application for MyChart, and verified that I am speaking with the correct person using two identifiers.   I introduced myself as a Designer, jewellery with the practice. We discussed the limitations of evaluation and management by telemedicine and the availability of in person appointments. The patient expressed understanding and agreed to proceed.  Participating parties in this visit include: The patient and the nurse practitioner listed.  The patient is: At home I am: In the office - [facility] at [facility]  Subjective:    CC:  Chief Complaint  Patient presents with   Pre-op Exam    HPI: Amy Thomas is a 42 y.o. year old female presenting today via [facility] today requesting pre-op labs.  Patient reports she is planning to have "laser liposuction and ab-ex" at [facility] at the end of the month. Reports they need an updating CBC and iron labs based on her history of anemia. She reports this is an awake procedure. Clearance forms have been signed with the exception that she discusses with GI regarding the Humira as we are not prescribing that for her - she reports they have told her in the past to hold doses if she is sick, so she doesn't think this will be an issue.    Past medical history, Surgical history, Family history not pertinant except as noted below, Social history, Allergies, and medications have been entered into the medical record, reviewed, and corrections made.   Review of Systems:  All review of systems negative except what is listed in the HPI   Objective:    General:  Speaking clearly in complete sentences. Absent shortness of breath noted.   Alert and oriented x3.   Normal judgment.  Absent acute distress.   Impression and Recommendations:    1. Anemia, unspecified type - CBC w/Diff;  Future - Iron, TIBC and Ferritin Panel; Future  Ordering labs for upcoming procedure. No other concerns today. She will confirm with GI regarding the Humira pre/post-procedure.    Follow-up if symptoms worsen or fail to improve.    I discussed the assessment and treatment plan with the patient. The patient was provided an opportunity to ask questions and all were answered. The patient agreed with the plan and demonstrated an understanding of the instructions.   The patient was advised to call back or seek an in-person evaluation if the symptoms worsen or if the condition fails to improve as anticipated.    Terrilyn Saver, NP

## 2022-07-18 ENCOUNTER — Other Ambulatory Visit (INDEPENDENT_AMBULATORY_CARE_PROVIDER_SITE_OTHER): Payer: 59

## 2022-07-18 ENCOUNTER — Telehealth: Payer: Self-pay | Admitting: Family Medicine

## 2022-07-18 ENCOUNTER — Other Ambulatory Visit (HOSPITAL_BASED_OUTPATIENT_CLINIC_OR_DEPARTMENT_OTHER): Payer: Self-pay

## 2022-07-18 DIAGNOSIS — D649 Anemia, unspecified: Secondary | ICD-10-CM | POA: Diagnosis not present

## 2022-07-18 LAB — CBC WITH DIFFERENTIAL/PLATELET
Basophils Absolute: 0 10*3/uL (ref 0.0–0.1)
Basophils Relative: 0.4 % (ref 0.0–3.0)
Eosinophils Absolute: 0.1 10*3/uL (ref 0.0–0.7)
Eosinophils Relative: 2.3 % (ref 0.0–5.0)
HCT: 35.8 % — ABNORMAL LOW (ref 36.0–46.0)
Hemoglobin: 11.6 g/dL — ABNORMAL LOW (ref 12.0–15.0)
Lymphocytes Relative: 24.9 % (ref 12.0–46.0)
Lymphs Abs: 1.5 10*3/uL (ref 0.7–4.0)
MCHC: 32.4 g/dL (ref 30.0–36.0)
MCV: 78.3 fl (ref 78.0–100.0)
Monocytes Absolute: 0.6 10*3/uL (ref 0.1–1.0)
Monocytes Relative: 9.3 % (ref 3.0–12.0)
Neutro Abs: 3.8 10*3/uL (ref 1.4–7.7)
Neutrophils Relative %: 63.1 % (ref 43.0–77.0)
Platelets: 307 10*3/uL (ref 150.0–400.0)
RBC: 4.57 Mil/uL (ref 3.87–5.11)
RDW: 22.6 % — ABNORMAL HIGH (ref 11.5–15.5)
WBC: 6.1 10*3/uL (ref 4.0–10.5)

## 2022-07-18 MED ORDER — CEPHALEXIN 500 MG PO CAPS
ORAL_CAPSULE | ORAL | 0 refills | Status: DC
  Filled 2022-07-18: qty 12, 3d supply, fill #0

## 2022-07-18 MED ORDER — GABAPENTIN 300 MG PO CAPS
ORAL_CAPSULE | ORAL | 0 refills | Status: DC
  Filled 2022-07-18: qty 2, 30d supply, fill #0

## 2022-07-18 MED ORDER — ONDANSETRON 8 MG PO TBDP
8.0000 mg | ORAL_TABLET | Freq: Three times a day (TID) | ORAL | 0 refills | Status: DC | PRN
  Filled 2022-07-18: qty 10, 3d supply, fill #0

## 2022-07-18 MED ORDER — OXYCODONE HCL 5 MG PO TABS
5.0000 mg | ORAL_TABLET | ORAL | 0 refills | Status: DC
  Filled 2022-07-18: qty 20, 4d supply, fill #0

## 2022-07-18 NOTE — Telephone Encounter (Signed)
Amy Thomas is requesting labs and medical clearance forms to be faxed to [phone].

## 2022-07-19 LAB — IRON,TIBC AND FERRITIN PANEL
%SAT: 14 % (calc) — ABNORMAL LOW (ref 16–45)
Ferritin: 17 ng/mL (ref 16–232)
Iron: 51 ug/dL (ref 40–190)
TIBC: 367 mcg/dL (calc) (ref 250–450)

## 2022-07-19 NOTE — Telephone Encounter (Signed)
faxed

## 2022-07-20 ENCOUNTER — Other Ambulatory Visit (HOSPITAL_BASED_OUTPATIENT_CLINIC_OR_DEPARTMENT_OTHER): Payer: Self-pay

## 2022-07-21 ENCOUNTER — Other Ambulatory Visit (HOSPITAL_COMMUNITY): Payer: Self-pay

## 2022-08-31 ENCOUNTER — Other Ambulatory Visit (HOSPITAL_COMMUNITY): Payer: Self-pay

## 2022-09-13 ENCOUNTER — Other Ambulatory Visit: Payer: Self-pay | Admitting: Family Medicine

## 2022-09-13 ENCOUNTER — Other Ambulatory Visit: Payer: Self-pay

## 2022-09-13 ENCOUNTER — Other Ambulatory Visit (HOSPITAL_BASED_OUTPATIENT_CLINIC_OR_DEPARTMENT_OTHER): Payer: Self-pay

## 2022-09-13 DIAGNOSIS — I10 Essential (primary) hypertension: Secondary | ICD-10-CM

## 2022-09-13 MED ORDER — LOSARTAN POTASSIUM 50 MG PO TABS
50.0000 mg | ORAL_TABLET | Freq: Every day | ORAL | 1 refills | Status: DC
  Filled 2022-09-13: qty 90, 90d supply, fill #0
  Filled 2023-07-13: qty 90, 90d supply, fill #1

## 2022-09-18 ENCOUNTER — Encounter: Payer: Self-pay | Admitting: Neurology

## 2022-09-18 ENCOUNTER — Other Ambulatory Visit (HOSPITAL_COMMUNITY): Payer: Self-pay

## 2022-09-18 ENCOUNTER — Other Ambulatory Visit (HOSPITAL_BASED_OUTPATIENT_CLINIC_OR_DEPARTMENT_OTHER): Payer: Self-pay

## 2022-09-18 ENCOUNTER — Telehealth: Payer: Self-pay

## 2022-09-18 NOTE — Telephone Encounter (Signed)
emgality and nurtec PA needed.

## 2022-09-19 ENCOUNTER — Ambulatory Visit (INDEPENDENT_AMBULATORY_CARE_PROVIDER_SITE_OTHER): Payer: 59 | Admitting: Family Medicine

## 2022-09-19 ENCOUNTER — Encounter: Payer: Self-pay | Admitting: Family Medicine

## 2022-09-19 VITALS — BP 130/83 | HR 84 | Temp 98.3°F | Resp 18 | Ht 67.0 in | Wt 167.4 lb

## 2022-09-19 DIAGNOSIS — Z7689 Persons encountering health services in other specified circumstances: Secondary | ICD-10-CM

## 2022-09-19 DIAGNOSIS — Z713 Dietary counseling and surveillance: Secondary | ICD-10-CM

## 2022-09-19 NOTE — Telephone Encounter (Addendum)
Patient Advocate Encounter  Prior Authorization for Emgality '120MG'$ /ML auto-injectors has been approved.    PA# [id number] Key: [place] Form Effective dates: 09/19/2023 through 09/19/2023      Lyndel Safe, [place] Patient Advocate Specialist [facility] Patient Advocate Team Direct Number: [phone]  Fax: [phone]

## 2022-09-19 NOTE — Telephone Encounter (Signed)
Patient Advocate Encounter   Received notification that prior authorization for Nurtec '75MG'$  dispersible tablets is required.   PA submitted on 09/19/2022 Key [id number] Insurance [employer] ePA Form Status is pending       Lyndel Safe, [place] Patient Advocate Specialist [place] Patient Advocate Team Direct Number: [phone]  Fax: [phone]

## 2022-09-19 NOTE — Progress Notes (Signed)
   Acute Office Visit  Subjective:     Patient ID: Amy Thomas, female    DOB: 23-Jan-1981, 42 y.o.   MRN: [id number]  Chief Complaint  Patient presents with   Phentermine and FMLA paperwork    1. Pt asks for [employer] paperwork to be filled out from her procedure. 2. She asks to start on Phentermine again.     HPI Patient is in today for weight management.   She had a mini-tummy-tuck at [facility] on 08/08/22. They gave her restrictions and she ended up missing some work. She needs FMLA forms filled out for the time missed because she reports surgical team would not do it. They gave her restrictions of no bending, twisting or lifting more than 15 pounds until after 09/05/22.  She would also like to discuss weight management. States she has done well on phentermine in the past. Also tried Contrave but no success. She is trying to eat healthy and get more active.        ROS All review of systems negative except what is listed in the HPI      Objective:    BP 130/83   Pulse 84   Temp 98.3 F (36.8 C) (Oral)   Resp 18   Ht 5\' 7"  (1.702 m)   Wt 167 lb 6.4 oz (75.9 kg)   SpO2 100%   BMI 26.22 kg/m    Physical Exam Vitals reviewed.  Constitutional:      Appearance: Normal appearance.  Cardiovascular:     Rate and Rhythm: Normal rate and regular rhythm.     Pulses: Normal pulses.     Heart sounds: Normal heart sounds.  Pulmonary:     Effort: Pulmonary effort is normal.     Breath sounds: Normal breath sounds.  Skin:    General: Skin is warm and dry.  Neurological:     Mental Status: She is alert and oriented to person, place, and time.  Psychiatric:        Mood and Affect: Mood normal.        Behavior: Behavior normal.        Thought Content: Thought content normal.        Judgment: Judgment normal.     No results found for any visits on 09/19/22.      Assessment & Plan:   Problem List Items Addressed This Visit   None Visit Diagnoses      Encounter for weight management    -  Primary FMLA papers for recent leave filled out.  Discussed risks of phentermine. Given previous use and history of hypertension, I do not feel comfortable prescribing. She is willing to see healthy weight and wellness team. Referral placed. Encouraged lifestyle measures.        No orders of the defined types were placed in this encounter.   Return if symptoms worsen or fail to improve.  Terrilyn Saver, NP

## 2022-09-21 ENCOUNTER — Other Ambulatory Visit (HOSPITAL_COMMUNITY): Payer: Self-pay

## 2022-09-22 ENCOUNTER — Other Ambulatory Visit: Payer: Self-pay

## 2022-09-22 ENCOUNTER — Other Ambulatory Visit (HOSPITAL_COMMUNITY): Payer: Self-pay

## 2022-09-22 NOTE — Telephone Encounter (Signed)
Pharmacy Patient Advocate Encounter  Prior Authorization for Nurtec 75MG  dispersible tablets has been approved by [employer] (ins).    PA # PA Case ID #: [id number] Effective dates: 09/21/2022 through 03/20/2023

## 2022-09-25 ENCOUNTER — Other Ambulatory Visit (HOSPITAL_BASED_OUTPATIENT_CLINIC_OR_DEPARTMENT_OTHER): Payer: Self-pay

## 2022-09-26 ENCOUNTER — Other Ambulatory Visit (HOSPITAL_BASED_OUTPATIENT_CLINIC_OR_DEPARTMENT_OTHER): Payer: Self-pay

## 2022-09-26 ENCOUNTER — Other Ambulatory Visit: Payer: Self-pay

## 2022-09-26 ENCOUNTER — Other Ambulatory Visit: Payer: No Typology Code available for payment source

## 2022-09-26 MED ORDER — FLUCONAZOLE 150 MG PO TABS
ORAL_TABLET | ORAL | 3 refills | Status: DC
  Filled 2022-09-26: qty 6, 9d supply, fill #0

## 2022-10-06 ENCOUNTER — Other Ambulatory Visit: Payer: Self-pay

## 2022-10-09 ENCOUNTER — Ambulatory Visit
Admission: EM | Admit: 2022-10-09 | Discharge: 2022-10-09 | Disposition: A | Discharge status: Home / Self Care | Payer: 59 | Attending: Family Medicine | Admitting: Family Medicine

## 2022-10-09 ENCOUNTER — Other Ambulatory Visit (HOSPITAL_BASED_OUTPATIENT_CLINIC_OR_DEPARTMENT_OTHER): Payer: Self-pay

## 2022-10-09 ENCOUNTER — Encounter: Payer: Self-pay | Admitting: Emergency Medicine

## 2022-10-09 DIAGNOSIS — N898 Other specified noninflammatory disorders of vagina: Secondary | ICD-10-CM | POA: Insufficient documentation

## 2022-10-09 DIAGNOSIS — N3001 Acute cystitis with hematuria: Secondary | ICD-10-CM | POA: Diagnosis not present

## 2022-10-09 DIAGNOSIS — I10 Essential (primary) hypertension: Secondary | ICD-10-CM | POA: Insufficient documentation

## 2022-10-09 DIAGNOSIS — E669 Obesity, unspecified: Secondary | ICD-10-CM | POA: Insufficient documentation

## 2022-10-09 LAB — POCT URINALYSIS DIP (MANUAL ENTRY)
Bilirubin, UA: NEGATIVE
Glucose, UA: NEGATIVE mg/dL
Ketones, POC UA: NEGATIVE mg/dL
Nitrite, UA: NEGATIVE
Protein Ur, POC: NEGATIVE mg/dL
Spec Grav, UA: >=1.03 — AB (ref 1.010–1.025)
Urobilinogen, UA: 0.2 E.U./dL
pH, UA: 6 (ref 5.0–8.0)

## 2022-10-09 MED ORDER — METRONIDAZOLE 500 MG PO TABS
500.0000 mg | ORAL_TABLET | Freq: Two times a day (BID) | ORAL | 0 refills | Status: DC
  Filled 2022-10-09: qty 14, 7d supply, fill #0

## 2022-10-09 MED ORDER — SULFAMETHOXAZOLE-TRIMETHOPRIM 800-160 MG PO TABS
1.0000 | ORAL_TABLET | Freq: Two times a day (BID) | ORAL | 0 refills | Status: AC
  Filled 2022-10-09: qty 6, 3d supply, fill #0

## 2022-10-09 NOTE — ED Provider Notes (Signed)
Amy Thomas CARE    CSN: [id number] Arrival date & time: 10/09/22  1907      History   Chief Complaint Chief Complaint  Patient presents with   Vaginal Discharge    HPI Amy Thomas is a 42 y.o. female.   HPI 42 year old female presents with vaginal discharge x 4 days.  PMH significant for anxiety, chronic insomnia, and HTN.  Past Medical History:  Diagnosis Date   Anxiety    Blood transfusion without reported diagnosis    Chronic headaches    Colon polyp    Crohn's disease    Depression    GERD (gastroesophageal reflux disease)    Heart murmur    Hypertension    Migraine    Neck pain     Patient Active Problem List   Diagnosis Date Noted   Overweight (BMI 25.0-29.9) 11/23/2021   Hypertension 10/19/2021   Crohn's disease 10/19/2021   Migraine 10/19/2021   Anxiety 07/21/2021   Chronic insomnia 07/21/2021   Obstructive sleep apnea 07/21/2021   Chronic migraine w/o aura w/o status migrainosus, not intractable 06/17/2020   Neck pain 06/17/2020    Past Surgical History:  Procedure Laterality Date   CERVICAL BIOPSY  W/ LOOP ELECTRODE EXCISION     to remove precancous cells    COLONOSCOPY  2017   [facility]   LYMPHADENECTOMY Left    TONSILLECTOMY AND ADENOIDECTOMY     TONSILLECTOMY AND ADENOIDECTOMY      OB History   No obstetric history on file.      Home Medications    Prior to Admission medications   Medication Sig Start Date End Date Taking? Authorizing Provider  metroNIDAZOLE (FLAGYL) 500 MG tablet Take 1 tablet (500 mg total) by mouth 2 (two) times daily. 10/09/22  Yes Eliezer Lofts, FNP  sulfamethoxazole-trimethoprim (BACTRIM DS) 800-160 MG tablet Take 1 tablet by mouth 2 (two) times daily for 3 days. 10/09/22 10/12/22 Yes Eliezer Lofts, FNP  Adalimumab (HUMIRA, 2 PEN,) 40 MG/0.4ML PNKT INJECT 40 MG INTO THE SKIN EVERY 14 (FOURTEEN) DAYS. 03/31/22 03/31/23  Tresa Garter, MD  BIOTIN PO Take by mouth.    [provider]  Ferrous Sulfate (IRON SUPPLEMENT PO) Take 2 tablets by mouth daily.    [provider]  fluconazole (DIFLUCAN) 150 MG tablet Take 1 tablet every 72 hours x 2 doses as needed 09/26/22     Galcanezumab-gnlm (EMGALITY) 120 MG/ML SOAJ Inject 120 mg into the skin every 30 (thirty) days. 06/28/22   Suzzanne Cloud, NP  influenza vac split quadrivalent PF (FLUARIX QUADRIVALENT) 0.5 ML injection Inject into the muscle. 05/01/22     levonorgestrel (MIRENA) 20 MCG/24HR IUD 1 each by Intrauterine route once.    [provider]  losartan (COZAAR) 50 MG tablet Take 1 tablet (50 mg total) by mouth daily. 09/13/22   Terrilyn Saver, NP  metroNIDAZOLE (METROGEL) 0.75 % vaginal gel Insert 5 grams vaginally daily for 10 days, then twice weekly for 16 weeks Patient taking differently: Place vaginally as needed. 09/13/21     nortriptyline (PAMELOR) 25 MG capsule Take 3 capsules (75 mg total) by mouth at bedtime. 06/28/22   Suzzanne Cloud, NP  ondansetron (ZOFRAN-ODT) 4 MG disintegrating tablet Take 1 tablet (4 mg total) by mouth every 8 (eight) hours as needed for nausea or vomiting. 07/21/21   Marcial Pacas, MD  ondansetron (ZOFRAN-ODT) 8 MG disintegrating tablet Take 1 tablet by mouth every 8 hours as needed for  nausea 07/18/22     phentermine 37.5 MG capsule Take 1 capsule (37.5 mg total) by mouth every morning. 03/06/22   Kennyth Arnold, FNP  Rimegepant Sulfate (NURTEC) 75 MG TBDP Take 75 mg by mouth as needed. 02/09/22   Suzzanne Cloud, NP  rizatriptan (MAXALT-MLT) 10 MG disintegrating tablet Take 1 tablet (10 mg total) by mouth as needed. May repeat in 2 hours if needed 06/28/22   Suzzanne Cloud, NP  tiZANidine (ZANAFLEX) 4 MG tablet Take 1 tablet (4 mg total) by mouth every 6 (six) hours as needed for muscle spasms. 06/28/22   Suzzanne Cloud, NP    Family History Family History  Problem Relation Age of Onset   Ovarian cancer Mother    Migraines Mother    Other Father        no medical  history of father   Stomach cancer Maternal Uncle    Asthma Maternal Grandmother    COPD Maternal Grandmother    Hyperlipidemia Maternal Grandmother    Hypertension Maternal Grandmother    Stroke Maternal Grandmother    Uterine cancer Maternal Grandmother    Heart disease Maternal Grandmother    Diabetes Maternal Grandmother    Colon cancer Neg Hx    Esophageal cancer Neg Hx    Sleep apnea Neg Hx     Social History Social History   Tobacco Use   Smoking status: Former    Types: Cigarettes    Quit date: 03/10/2014    Years since quitting: 8.5   Smokeless tobacco: Never  Vaping Use   Vaping Use: Never used  Substance Use Topics   Alcohol use: Yes    Comment: occasionally   Drug use: Not Currently    Types: Marijuana     Allergies   Cefaclor   Review of Systems Review of Systems  Genitourinary:  Positive for vaginal discharge.  All other systems reviewed and are negative.    Physical Exam Triage Vital Signs ED Triage Vitals  Enc Vitals Group     BP 10/09/22 1925 (!) 143/100     Pulse Rate 10/09/22 1925 76     Resp 10/09/22 1925 17     Temp 10/09/22 1925 98.4 F (36.9 C)     Temp Source 10/09/22 1925 Oral     SpO2 10/09/22 1925 100 %     Weight --      Height --      Head Circumference --      Peak Flow --      Pain Score 10/09/22 1926 0     Pain Loc --      Pain Edu? --      Excl. in [place]? --    No data found.  Updated Vital Signs BP (!) 143/100 (BP Location: Right Arm)   Pulse 76   Temp 98.4 F (36.9 C) (Oral)   Resp 17   SpO2 100%       Physical Exam Vitals and nursing note reviewed.  Constitutional:      General: She is not in acute distress.    Appearance: Normal appearance. She is obese. She is not ill-appearing.  HENT:     Head: Normocephalic and atraumatic.     Mouth/Throat:     Mouth: Mucous membranes are moist.     Pharynx: Oropharynx is clear.  Eyes:     Extraocular Movements: Extraocular movements intact.      Conjunctiva/sclera: Conjunctivae normal.     Pupils: Pupils are  equal, round, and reactive to light.  Cardiovascular:     Rate and Rhythm: Normal rate and regular rhythm.     Pulses: Normal pulses.     Heart sounds: Normal heart sounds.  Pulmonary:     Effort: Pulmonary effort is normal.     Breath sounds: Normal breath sounds. No wheezing, rhonchi or rales.  Abdominal:     Tenderness: There is no right CVA tenderness or left CVA tenderness.  Musculoskeletal:        General: Normal range of motion.     Cervical back: Normal range of motion and neck supple.  Skin:    General: Skin is warm and dry.  Neurological:     General: No focal deficit present.     Mental Status: She is alert and oriented to person, place, and time.      UC Treatments / Results  Labs (all labs ordered are listed, but only abnormal results are displayed) Labs Reviewed  POCT URINALYSIS DIP (MANUAL ENTRY) - Abnormal; Notable for the following components:      Result Value   Spec Grav, UA >=1.030 (*)    Blood, UA trace-intact (*)    Leukocytes, UA Trace (*)    All other components within normal limits  URINE CULTURE  CERVICOVAGINAL ANCILLARY ONLY    EKG   Radiology No results found.  Procedures Procedures (including critical care time)  Medications Ordered in UC Medications - No data to display  Initial Impression / Assessment and Plan / UC Course  I have reviewed the triage vital signs and the nursing notes.  Pertinent labs & imaging results that were available during my care of the patient were reviewed by me and considered in my medical decision making (see chart for details).     DM: 1.  Acute cystitis with hematuria-UA revealed above, urine culture ordered, Rx'd Bactrim 800/160 mg tablet twice daily x 3 days; 2.  Vaginal discharge-Aptima swab ordered, Rx'd Flagyl 500 mg twice daily x 7 days. Advised patient to take medications as directed with food to completion.  Encouraged patient to  increase daily water intake to 64 ounces per day while taking these medications.  Advised if symptoms worsen and/or unresolved please follow-up with PCP or here for further evaluation.  Advised patient we will follow-up with Aptima swab and urine culture results once received.  Patient discharged home, hemodynamically stable. Final Clinical Impressions(s) / UC Diagnoses   Final diagnoses:  Vaginal discharge  Acute cystitis with hematuria     Discharge Instructions      Advised patient to take medications as directed with food to completion.  Encouraged patient to increase daily water intake to 64 ounces per day while taking these medications.  Advised if symptoms worsen and/or unresolved please follow-up with PCP or here for further evaluation.  Advised patient we will follow-up with Aptima swab and urine culture results once received.     ED Prescriptions     Medication Sig Dispense Auth. Provider   metroNIDAZOLE (FLAGYL) 500 MG tablet Take 1 tablet (500 mg total) by mouth 2 (two) times daily. 14 tablet Eliezer Lofts, FNP   sulfamethoxazole-trimethoprim (BACTRIM DS) 800-160 MG tablet Take 1 tablet by mouth 2 (two) times daily for 3 days. 6 tablet Eliezer Lofts, FNP      PDMP not reviewed this encounter.   Eliezer Lofts, FNP 10/09/22 2008

## 2022-10-09 NOTE — ED Triage Notes (Signed)
Pt reports a condom breaking during intercourse and now experiencing vaginal discharge x 4 days. States the discharge has a yellow-tinged color. Also reports mild external vaginal discomfort.

## 2022-10-09 NOTE — Discharge Instructions (Addendum)
Advised patient to take medications as directed with food to completion.  Encouraged patient to increase daily water intake to 64 ounces per day while taking these medications.  Advised if symptoms worsen and/or unresolved please follow-up with PCP or here for further evaluation.  Advised patient we will follow-up with Aptima swab and urine culture results once received.

## 2022-10-10 ENCOUNTER — Other Ambulatory Visit (HOSPITAL_BASED_OUTPATIENT_CLINIC_OR_DEPARTMENT_OTHER): Payer: Self-pay

## 2022-10-11 LAB — CERVICOVAGINAL ANCILLARY ONLY
Bacterial Vaginitis (gardnerella): NEGATIVE
Candida Glabrata: NEGATIVE
Candida Vaginitis: NEGATIVE
Chlamydia: NEGATIVE
Comment: NEGATIVE
Comment: NEGATIVE
Comment: NEGATIVE
Comment: NEGATIVE
Comment: NEGATIVE
Comment: NORMAL
Neisseria Gonorrhea: NEGATIVE
Trichomonas: NEGATIVE

## 2022-10-11 LAB — URINE CULTURE: Culture: 80000 — AB

## 2022-10-17 ENCOUNTER — Other Ambulatory Visit: Payer: Self-pay

## 2022-10-19 ENCOUNTER — Other Ambulatory Visit: Payer: Self-pay

## 2022-10-24 ENCOUNTER — Other Ambulatory Visit (HOSPITAL_COMMUNITY): Payer: Self-pay

## 2022-11-02 ENCOUNTER — Other Ambulatory Visit (HOSPITAL_COMMUNITY): Payer: Self-pay

## 2022-11-21 ENCOUNTER — Other Ambulatory Visit (HOSPITAL_COMMUNITY): Payer: Self-pay

## 2022-11-22 ENCOUNTER — Other Ambulatory Visit: Payer: Self-pay

## 2022-11-22 ENCOUNTER — Other Ambulatory Visit (HOSPITAL_COMMUNITY): Payer: Self-pay

## 2022-11-23 ENCOUNTER — Other Ambulatory Visit (HOSPITAL_COMMUNITY): Payer: Self-pay

## 2022-11-24 ENCOUNTER — Other Ambulatory Visit: Payer: Self-pay

## 2022-11-27 ENCOUNTER — Other Ambulatory Visit (HOSPITAL_COMMUNITY): Payer: Self-pay

## 2022-11-28 ENCOUNTER — Other Ambulatory Visit: Payer: Self-pay

## 2022-11-29 ENCOUNTER — Telehealth: Payer: Self-pay

## 2022-11-29 ENCOUNTER — Other Ambulatory Visit: Payer: Self-pay

## 2022-11-29 ENCOUNTER — Other Ambulatory Visit (HOSPITAL_COMMUNITY): Payer: Self-pay

## 2022-11-29 DIAGNOSIS — K50019 Crohn's disease of small intestine with unspecified complications: Secondary | ICD-10-CM

## 2022-11-29 DIAGNOSIS — K50919 Crohn's disease, unspecified, with unspecified complications: Secondary | ICD-10-CM

## 2022-11-29 MED ORDER — HUMIRA (2 PEN) 40 MG/0.8ML ~~LOC~~ AJKT
1.0000 | AUTO-INJECTOR | SUBCUTANEOUS | 6 refills | Status: DC
  Filled 2022-11-29: qty 2, fill #0

## 2022-11-29 NOTE — Telephone Encounter (Signed)
Received fax from [employer] requesting that Humira 40mg /0.8 ml pen be sent in for pt for insurance reasons. Prescription sent in. Pt made aware.  Pt stated that she has been having bloody mucous stools for a month now and requesting an office visit.  Pt was notified that we would call her tomorrow morning with a date and time for office visit.

## 2022-11-30 ENCOUNTER — Other Ambulatory Visit (HOSPITAL_COMMUNITY): Payer: Self-pay

## 2022-11-30 ENCOUNTER — Other Ambulatory Visit: Payer: Self-pay | Admitting: Pharmacist

## 2022-11-30 ENCOUNTER — Other Ambulatory Visit: Payer: Self-pay

## 2022-11-30 DIAGNOSIS — K50019 Crohn's disease of small intestine with unspecified complications: Secondary | ICD-10-CM

## 2022-11-30 DIAGNOSIS — K50919 Crohn's disease, unspecified, with unspecified complications: Secondary | ICD-10-CM

## 2022-11-30 MED ORDER — HUMIRA (2 PEN) 40 MG/0.8ML ~~LOC~~ AJKT
1.0000 | AUTO-INJECTOR | SUBCUTANEOUS | 6 refills | Status: DC
  Filled 2022-11-30: qty 2, fill #0
  Filled 2022-11-30: qty 2, 28d supply, fill #0
  Filled 2022-12-22: qty 2, 28d supply, fill #1
  Filled 2023-02-27: qty 2, 28d supply, fill #2
  Filled 2023-03-20: qty 2, 28d supply, fill #3
  Filled 2023-06-13: qty 2, 28d supply, fill #4
  Filled 2023-06-28 – 2023-08-27 (×3): qty 2, 28d supply, fill #5
  Filled 2023-09-18: qty 2, 28d supply, fill #6

## 2022-11-30 NOTE — Telephone Encounter (Signed)
Pt was scheduled to see Mike Gip PA on 12/07/2022 at 3:00 PM. For bloody mucous stools for a month, no abdominal pain or any other symptoms.  Pt made aware. Pt verbalized understanding with all questions answered.

## 2022-12-05 ENCOUNTER — Telehealth: Payer: 59 | Admitting: Physician Assistant

## 2022-12-05 ENCOUNTER — Other Ambulatory Visit (HOSPITAL_BASED_OUTPATIENT_CLINIC_OR_DEPARTMENT_OTHER): Payer: Self-pay

## 2022-12-05 DIAGNOSIS — J028 Acute pharyngitis due to other specified organisms: Secondary | ICD-10-CM

## 2022-12-05 DIAGNOSIS — B9689 Other specified bacterial agents as the cause of diseases classified elsewhere: Secondary | ICD-10-CM | POA: Diagnosis not present

## 2022-12-05 MED ORDER — AZITHROMYCIN 250 MG PO TABS
ORAL_TABLET | ORAL | 0 refills | Status: AC
  Filled 2022-12-05: qty 6, 5d supply, fill #0

## 2022-12-05 NOTE — Progress Notes (Signed)
E-Visit for Sore Throat - Strep Symptoms  We are sorry that you are not feeling well.  Here is how we plan to help!  Based on what you have shared with me it is likely that you have a bacterial pharyngitis. I have prescribed Azithromycin to take as directed.  For throat pain, we recommend over the counter oral pain relief medications such as acetaminophen or aspirin, or anti-inflammatory medications such as ibuprofen or naproxen sodium. Topical treatments such as oral throat lozenges or sprays may be used as needed. Strep infections are not as easily transmitted as other respiratory infections, however we still recommend that you avoid close contact with loved ones, especially the very young and elderly.  Remember to wash your hands thoroughly throughout the day as this is the number one way to prevent the spread of infection and wipe down door knobs and counters with disinfectant.   Home Care: Only take medications as instructed by your medical team. Complete the entire course of an antibiotic. Do not take these medications with alcohol. A steam or ultrasonic humidifier can help congestion.  You can place a towel over your head and breathe in the steam from hot water coming from a faucet. Avoid close contacts especially the very young and the elderly. Cover your mouth when you cough or sneeze. Always remember to wash your hands.  Get Help Right Away If: You develop worsening fever or sinus pain. You develop a severe head ache or visual changes. Your symptoms persist after you have completed your treatment plan.  Make sure you Understand these instructions. Will watch your condition. Will get help right away if you are not doing well or get worse.   Thank you for choosing an e-visit.  Your e-visit answers were reviewed by a board certified advanced clinical practitioner to complete your personal care plan. Depending upon the condition, your plan could have included both over the counter  or prescription medications.  Please review your pharmacy choice. Make sure the pharmacy is open so you can pick up prescription now. If there is a problem, you may contact your provider through [employer] and have the prescription routed to another pharmacy.  Your safety is important to [place]. If you have drug allergies check your prescription carefully.   For the next 24 hours you can use MyChart to ask questions about today's visit, request a non-urgent call back, or ask for a work or school excuse. You will get an email in the next two days asking about your experience. I hope that your e-visit has been valuable and will speed your recovery.

## 2022-12-05 NOTE — Progress Notes (Signed)
Message sent to patient requesting further input regarding current symptoms. Awaiting patient response.  

## 2022-12-05 NOTE — Progress Notes (Signed)
I have spent 5 minutes in review of e-visit questionnaire, review and updating patient chart, medical decision making and response to patient.   Eliot Popper Cody Lyncoln Maskell, PA-C    

## 2022-12-07 ENCOUNTER — Telehealth: Payer: Self-pay

## 2022-12-07 ENCOUNTER — Ambulatory Visit: Payer: 59 | Admitting: Physician Assistant

## 2022-12-07 ENCOUNTER — Other Ambulatory Visit: Payer: 59

## 2022-12-07 DIAGNOSIS — K50019 Crohn's disease of small intestine with unspecified complications: Secondary | ICD-10-CM

## 2022-12-07 NOTE — Telephone Encounter (Signed)
Patient is scheduled for an office visit with Mike Gip PA , provider is ill and will not be in the office today.  Dr. Chales Abrahams has reviewed the patient's chart.  Per Dr. Chales Abrahams patient needs labs and reschedule an office visit with him for next week.  CBC, CMP, CRP, GI pathogen panel, fecal calprotectin, and Humira trough and antibody level if she has not missed any Humira doses.   Left message for patient to call back and discuss

## 2022-12-07 NOTE — Telephone Encounter (Signed)
error 

## 2022-12-18 NOTE — Progress Notes (Deleted)
12/18/2022 Amy Thomas [id number] 03-Jan-1981  Referring provider: Clayborne Dana, NP Primary GI doctor: Dr. Chales Abrahams  ASSESSMENT AND PLAN:   There are no diagnoses linked to this encounter. B12 Sed rate H  Patient Care Team: Clayborne Dana, NP as PCP - General (Family Medicine)  HISTORY OF PRESENT ILLNESS: 42 y.o. female with a past medical history of anxiety, migraines, hypertension, reflux, Crohn's disease and others listed below presents for evaluation of fiber stenosing ileocolonic Crohn's disease with terminal ileum stricture 2021.   Last seen in the office on 08/12/2021 by Dr. Chales Abrahams.   IBD history  Diagnosed 2017 after colonoscopy with Dr. Noe Gens, required steroids and antibiotics with Lialda. Age 50/30 partial small bowel obstruction treated with IV steroids, continued on Lialda declined Biologics at that time. 04/2020 Humira started, declined Imuran after reading side effects, never started. Typical flare is right lower quadrant pain with nausea, normally constipation has never had diarrhea melena or hematochezia.  Last colonoscopy: 03/10/2020, medication patient was on Lialda, showed Crohn's disease involving cecum and rectum.  Moderate activity, tight cecal stricture at the level of ileocecal valve, terminal ileal stricture TI could not be intubated, mild sigmoid diverticulosis. Last small bowel imaging: 03/17/2020 active Crohn's disease involving terminal ileum.  No evidence of fistula abscess or bowel obstruction. Extraintestinal manifestations: {acextraintestinal symptoms:26754} Surgical history: {ibd surg:17890}.  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   {abd pain:17887}.   The patient {has/not:15037} fever.   The patient {has/not:15037} weight loss.    Recent labs: 05/20/2020 CRP <1.0 9/01/2021HS CRP  8.010 05/20/2020 SED RATE 13 Fecal cal *** 07/18/2022 WBC 6.1 HGB 11.6 MCV 78.3 Platelets 307.0 07/18/2022 Iron 51 Ferritin 17  03/10/2020 B12 285 Low 11/23/2021 AST 16 ALT 17 Alkphos 37 TBili 0.4 No vitamin D  TB GOLD 03/10/2020 NEGATIVE or TB skin if indeterminate.  HepBsAG 03/10/2020 NON-REACTIVE   IBD Health Care Maintenance: Annual Flu Vaccine -  Pneumococcal Vaccine if receiving immunosuppression: -   TB testing if on anti-TNF, yearly -  Vitamin D screening -  COVID vaccine  Shingrix   Immunization History  Administered Date(s) Administered   Influenza-Unspecified 04/25/2018, 05/07/2019   PFIZER(Purple Top)SARS-COV-2 Vaccination 07/12/2019, 08/05/2019   Tdap 01/07/2021      She  reports that she quit smoking about 8 years ago. Her smoking use included cigarettes. She has never used smokeless tobacco. She reports current alcohol use. She reports that she does not currently use drugs after having used the following drugs: Marijuana.  RELEVANT LABS AND IMAGING: CBC    Component Value Date/Time   WBC 6.1 07/18/2022 0755   RBC 4.57 07/18/2022 0755   HGB 11.6 (L) 07/18/2022 0755   HCT 35.8 (L) 07/18/2022 0755   PLT 307.0 07/18/2022 0755   MCV 78.3 07/18/2022 0755   MCHC 32.4 07/18/2022 0755   RDW 22.6 (H) 07/18/2022 0755   LYMPHSABS 1.5 07/18/2022 0755   MONOABS 0.6 07/18/2022 0755   EOSABS 0.1 07/18/2022 0755   BASOSABS 0.0 07/18/2022 0755   Recent Labs    07/18/22 0755  HGB 11.6*    CMP     Component Value Date/Time   NA 137 11/23/2021 1523   K 3.9 11/23/2021 1523   CL 101 11/23/2021 1523   CO2 27 11/23/2021 1523   GLUCOSE 82 11/23/2021 1523   BUN 13 11/23/2021 1523   CREATININE 0.70  11/23/2021 1523   CALCIUM 9.7 11/23/2021 1523   PROT 8.0 11/23/2021 1523   ALBUMIN 4.8 11/23/2021 1523   AST 16 11/23/2021 1523   ALT 17 11/23/2021 1523   ALKPHOS 37 (L) 11/23/2021 1523   BILITOT 0.4 11/23/2021 1523      Latest Ref Rng & Units 11/23/2021    3:23 PM  05/20/2020   11:19 AM 03/10/2020   11:01 AM  Hepatic Function  Total Protein 6.0 - 8.3 g/dL 8.0  7.0  7.4   Albumin 3.5 - 5.2 g/dL 4.8  4.0  4.2   AST 0 - 37 U/L 16  19  12    ALT 0 - 35 U/L 17  29  10    Alk Phosphatase 39 - 117 U/L 37  47  42   Total Bilirubin 0.2 - 1.2 mg/dL 0.4  0.5  0.3       Current Medications:   Current Outpatient Medications (Endocrine & Metabolic):    levonorgestrel (MIRENA) 20 MCG/24HR IUD, 1 each by Intrauterine route once.  Current Outpatient Medications (Cardiovascular):    losartan (COZAAR) 50 MG tablet, Take 1 tablet (50 mg total) by mouth daily.   Current Outpatient Medications (Analgesics):    Adalimumab (HUMIRA, 2 PEN,) 40 MG/0.8ML PNKT, Inject 1 Pen into the skin every 14 (fourteen) days.   Galcanezumab-gnlm (EMGALITY) 120 MG/ML SOAJ, Inject 120 mg into the skin every 30 (thirty) days.   Rimegepant Sulfate (NURTEC) 75 MG TBDP, Take 75 mg by mouth as needed.   rizatriptan (MAXALT-MLT) 10 MG disintegrating tablet, Take 1 tablet (10 mg total) by mouth as needed. May repeat in 2 hours if needed  Current Outpatient Medications (Hematological):    Ferrous Sulfate (IRON SUPPLEMENT PO), Take 2 tablets by mouth daily.  Current Outpatient Medications (Other):    BIOTIN PO, Take by mouth.   fluconazole (DIFLUCAN) 150 MG tablet, Take 1 tablet every 72 hours x 2 doses as needed   influenza vac split quadrivalent PF (FLUARIX QUADRIVALENT) 0.5 ML injection, Inject into the muscle.   metroNIDAZOLE (FLAGYL) 500 MG tablet, Take 1 tablet (500 mg total) by mouth 2 (two) times daily.   metroNIDAZOLE (METROGEL) 0.75 % vaginal gel, Insert 5 grams vaginally daily for 10 days, then twice weekly for 16 weeks (Patient taking differently: Place vaginally as needed.)   nortriptyline (PAMELOR) 25 MG capsule, Take 3 capsules (75 mg total) by mouth at bedtime.   ondansetron (ZOFRAN-ODT) 4 MG disintegrating tablet, Take 1 tablet (4 mg total) by mouth every 8 (eight) hours as  needed for nausea or vomiting.   ondansetron (ZOFRAN-ODT) 8 MG disintegrating tablet, Take 1 tablet by mouth every 8 hours as needed for nausea   phentermine 37.5 MG capsule, Take 1 capsule (37.5 mg total) by mouth every morning.   tiZANidine (ZANAFLEX) 4 MG tablet, Take 1 tablet (4 mg total) by mouth every 6 (six) hours as needed for muscle spasms.  Medical History:  Past Medical History:  Diagnosis Date   Anxiety    Blood transfusion without reported diagnosis    Chronic headaches    Colon polyp    Crohn's disease (HCC)    Depression    GERD (gastroesophageal reflux disease)    Heart murmur    Hypertension    Migraine    Neck pain    Allergies:  Allergies  Allergen Reactions   Cefaclor Shortness Of Breath     Surgical History:  She  has a past surgical history that includes Colonoscopy (  2017); Tonsillectomy and adenoidectomy; Cervical biopsy w/ loop electrode excision; Lymphadenectomy (Left); and Tonsillectomy and adenoidectomy. Family History:  Her family history includes Asthma in her maternal grandmother; COPD in her maternal grandmother; Diabetes in her maternal grandmother; Heart disease in her maternal grandmother; Hyperlipidemia in her maternal grandmother; Hypertension in her maternal grandmother; Migraines in her mother; Other in her father; Ovarian cancer in her mother; Stomach cancer in her maternal uncle; Stroke in her maternal grandmother; Uterine cancer in her maternal grandmother.  REVIEW OF SYSTEMS  : All other systems reviewed and negative except where noted in the History of Present Illness.  PHYSICAL EXAM: There were no vitals taken for this visit. General Appearance: Well nourished, in no apparent distress. Head:   Normocephalic and atraumatic. Eyes:  sclerae anicteric,conjunctive pink  Respiratory: Respiratory effort normal, BS equal bilaterally without rales, rhonchi, wheezing. Cardio: RRR with no MRGs. Peripheral pulses intact.  Abdomen: Soft,   {BlankSingle:19197::"Flat","Obese","Non-distended"} ,active bowel sounds. {actendernessAB:27319} tenderness {anatomy; site abdomen:5010}. {BlankMultiple:19196::"Without guarding","With guarding","Without rebound","With rebound"}. No masses. Rectal: {acrectalexam:27461} Musculoskeletal: Full ROM, {PSY - GAIT AND STATION:22860} gait. {With/Without:304960234} edema. Skin:  Dry and intact without significant lesions or rashes Neuro: Alert and  oriented x4;  No focal deficits. Psych:  Cooperative. Normal mood and affect.    Doree Albee, PA-C 10:12 AM

## 2022-12-19 ENCOUNTER — Ambulatory Visit: Payer: 59 | Admitting: Physician Assistant

## 2022-12-19 DIAGNOSIS — D508 Other iron deficiency anemias: Secondary | ICD-10-CM

## 2022-12-19 DIAGNOSIS — K50019 Crohn's disease of small intestine with unspecified complications: Secondary | ICD-10-CM

## 2022-12-20 ENCOUNTER — Other Ambulatory Visit (HOSPITAL_COMMUNITY): Payer: Self-pay

## 2022-12-22 ENCOUNTER — Other Ambulatory Visit (HOSPITAL_COMMUNITY): Payer: Self-pay

## 2023-01-10 ENCOUNTER — Ambulatory Visit
Admission: EM | Admit: 2023-01-10 | Discharge: 2023-01-10 | Disposition: A | Discharge status: Home / Self Care | Payer: 59 | Attending: Family Medicine | Admitting: Family Medicine

## 2023-01-10 ENCOUNTER — Encounter: Payer: Self-pay | Admitting: Emergency Medicine

## 2023-01-10 DIAGNOSIS — Z711 Person with feared health complaint in whom no diagnosis is made: Secondary | ICD-10-CM | POA: Diagnosis not present

## 2023-01-10 NOTE — ED Triage Notes (Signed)
Patient c/o vaginal discharge possible yeast infection x 3 days.  Patient does have recurrent yeast infections so she has Diflucan on hand.  Took one 3 days ago, still having sx's.  Recently found out her partner was not faithful, requesting STI testing including bloodwork.

## 2023-01-10 NOTE — ED Provider Notes (Signed)
[facility]    CSN: [id number] Arrival date & time: 01/10/23  1747      History   Chief Complaint Chief Complaint  Patient presents with   Vaginal Discharge    HPI Amy Thomas is a 42 y.o. female.   HPI Pleasant 42 year old female presents with concern for possible STD.  Patient request STD testing as partner has not been faithful to her. PMH significant for Crohn's disease, HTN, and migraine.  Past Medical History:  Diagnosis Date   Anxiety    Blood transfusion without reported diagnosis    Chronic headaches    Colon polyp    Crohn's disease (HCC)    Depression    GERD (gastroesophageal reflux disease)    Heart murmur    Hypertension    Migraine    Neck pain     Patient Active Problem List   Diagnosis Date Noted   Overweight (BMI 25.0-29.9) 11/23/2021   Hypertension 10/19/2021   Crohn's disease (HCC) 10/19/2021   Migraine 10/19/2021   Anxiety 07/21/2021   Chronic insomnia 07/21/2021   Obstructive sleep apnea 07/21/2021   Chronic migraine w/o aura w/o status migrainosus, not intractable 06/17/2020   Neck pain 06/17/2020    Past Surgical History:  Procedure Laterality Date   CERVICAL BIOPSY  W/ LOOP ELECTRODE EXCISION     to remove precancous cells    COLONOSCOPY  2017   [facility]   LYMPHADENECTOMY Left    TONSILLECTOMY AND ADENOIDECTOMY     TONSILLECTOMY AND ADENOIDECTOMY      OB History   No obstetric history on file.      Home Medications    Prior to Admission medications   Medication Sig Start Date End Date Taking? Authorizing Provider  Adalimumab (HUMIRA, 2 PEN,) 40 MG/0.8ML PNKT Inject 1 Pen into the skin every 14 (fourteen) days. 11/30/22  Yes Quentin Angst, MD  BIOTIN PO Take by mouth.   Yes [provider]  Ferrous Sulfate (IRON SUPPLEMENT PO) Take 2 tablets by mouth daily.   Yes [provider]  Galcanezumab-gnlm (EMGALITY) 120 MG/ML SOAJ Inject 120 mg into the skin every 30  (thirty) days. 06/28/22  Yes Glean Salvo, NP  influenza vac split quadrivalent PF (FLUARIX QUADRIVALENT) 0.5 ML injection Inject into the muscle. 05/01/22  Yes   levonorgestrel (MIRENA) 20 MCG/24HR IUD 1 each by Intrauterine route once.   Yes [provider]  losartan (COZAAR) 50 MG tablet Take 1 tablet (50 mg total) by mouth daily. 09/13/22  Yes Clayborne Dana, NP  metroNIDAZOLE (FLAGYL) 500 MG tablet Take 1 tablet (500 mg total) by mouth 2 (two) times daily. 10/09/22  Yes Trevor Iha, FNP  metroNIDAZOLE (METROGEL) 0.75 % vaginal gel Insert 5 grams vaginally daily for 10 days, then twice weekly for 16 weeks Patient taking differently: Place vaginally as needed. 09/13/21  Yes   nortriptyline (PAMELOR) 25 MG capsule Take 3 capsules (75 mg total) by mouth at bedtime. 06/28/22  Yes Glean Salvo, NP  ondansetron (ZOFRAN-ODT) 4 MG disintegrating tablet Take 1 tablet (4 mg total) by mouth every 8 (eight) hours as needed for nausea or vomiting. 07/21/21  Yes Levert Feinstein, MD  ondansetron (ZOFRAN-ODT) 8 MG disintegrating tablet Take 1 tablet by mouth every 8 hours as needed for nausea 07/18/22  Yes   phentermine 37.5 MG capsule Take 1 capsule (37.5 mg total) by mouth every morning. 03/06/22  Yes Worthy Rancher B, FNP  Rimegepant Sulfate (NURTEC) 75  MG TBDP Take 75 mg by mouth as needed. 02/09/22  Yes Glean Salvo, NP  rizatriptan (MAXALT-MLT) 10 MG disintegrating tablet Take 1 tablet (10 mg total) by mouth as needed. May repeat in 2 hours if needed 06/28/22  Yes Glean Salvo, NP  tiZANidine (ZANAFLEX) 4 MG tablet Take 1 tablet (4 mg total) by mouth every 6 (six) hours as needed for muscle spasms. 06/28/22  Yes Glean Salvo, NP    Family History Family History  Problem Relation Age of Onset   Ovarian cancer Mother    Migraines Mother    Other Father        no medical history of father   Stomach cancer Maternal Uncle    Asthma Maternal Grandmother    COPD Maternal Grandmother     Hyperlipidemia Maternal Grandmother    Hypertension Maternal Grandmother    Stroke Maternal Grandmother    Uterine cancer Maternal Grandmother    Heart disease Maternal Grandmother    Diabetes Maternal Grandmother    Colon cancer Neg Hx    Esophageal cancer Neg Hx    Sleep apnea Neg Hx     Social History Social History   Tobacco Use   Smoking status: Former    Types: Cigarettes    Quit date: 03/10/2014    Years since quitting: 8.8   Smokeless tobacco: Never  Vaping Use   Vaping Use: Never used  Substance Use Topics   Alcohol use: Yes    Comment: occasionally   Drug use: Not Currently    Types: Marijuana     Allergies   Cefaclor   Review of Systems Review of Systems  Genitourinary:  Positive for vaginal discharge.       Vaginal itching     Physical Exam Triage Vital Signs ED Triage Vitals  Enc Vitals Group     BP 01/10/23 1802 (!) 151/96     Pulse Rate 01/10/23 1802 78     Resp 01/10/23 1802 16     Temp 01/10/23 1802 98.8 F (37.1 C)     Temp Source 01/10/23 1802 Oral     SpO2 01/10/23 1802 100 %     Weight 01/10/23 1804 163 lb (73.9 kg)     Height 01/10/23 1804 5\' 7"  (1.702 m)     Head Circumference --      Peak Flow --      Pain Score 01/10/23 1804 0     Pain Loc --      Pain Edu? --      Excl. in GC? --    No data found.  Updated Vital Signs BP (!) 151/96 (BP Location: Right Arm)   Pulse 78   Temp 98.8 F (37.1 C) (Oral)   Resp 16   Ht 5\' 7"  (1.702 m)   Wt 163 lb (73.9 kg)   SpO2 100%   BMI 25.53 kg/m   Physical Exam Vitals and nursing note reviewed.  Constitutional:      Appearance: Normal appearance.  HENT:     Head: Normocephalic and atraumatic.     Mouth/Throat:     Mouth: Mucous membranes are moist.     Pharynx: Oropharynx is clear.  Eyes:     Extraocular Movements: Extraocular movements intact.     Conjunctiva/sclera: Conjunctivae normal.     Pupils: Pupils are equal, round, and reactive to light.  Cardiovascular:     Rate  and Rhythm: Normal rate and regular rhythm.     Pulses:  Normal pulses.     Heart sounds: Normal heart sounds.  Pulmonary:     Effort: Pulmonary effort is normal.     Breath sounds: Normal breath sounds. No wheezing, rhonchi or rales.  Musculoskeletal:        General: Normal range of motion.     Cervical back: Normal range of motion and neck supple.  Skin:    General: Skin is warm and dry.  Neurological:     General: No focal deficit present.     Mental Status: She is alert and oriented to person, place, and time. Mental status is at baseline.  Psychiatric:        Mood and Affect: Mood normal.        Behavior: Behavior normal.      UC Treatments / Results  Labs (all labs ordered are listed, but only abnormal results are displayed) Labs Reviewed  CERVICOVAGINAL ANCILLARY ONLY    EKG   Radiology No results found.  Procedures Procedures (including critical care time)  Medications Ordered in UC Medications - No data to display  Initial Impression / Assessment and Plan / UC Course  I have reviewed the triage vital signs and the nursing notes.  Pertinent labs & imaging results that were available during my care of the patient were reviewed by me and considered in my medical decision making (see chart for details).     MDM: 1.  Concerned about STD in female without diagnosis-Aptima swab ordered. Advised patient we will follow-up once Aptima swab results are returned.  Discharged home, hemodynamically stable. Final Clinical Impressions(s) / UC Diagnoses   Final diagnoses:  Concern about STD in female without diagnosis     Discharge Instructions      Advised patient we will follow-up once Aptima swab results are returned.     ED Prescriptions   None    PDMP not reviewed this encounter.   Trevor Iha, FNP 01/10/23 1901

## 2023-01-10 NOTE — Discharge Instructions (Addendum)
Advised patient we will follow-up once Aptima swab results are returned.

## 2023-01-15 ENCOUNTER — Other Ambulatory Visit (HOSPITAL_BASED_OUTPATIENT_CLINIC_OR_DEPARTMENT_OTHER): Payer: Self-pay

## 2023-01-15 ENCOUNTER — Telehealth (HOSPITAL_COMMUNITY): Payer: Self-pay | Admitting: Emergency Medicine

## 2023-01-15 LAB — CERVICOVAGINAL ANCILLARY ONLY
Bacterial Vaginitis (gardnerella): NEGATIVE
Candida Glabrata: NEGATIVE
Candida Vaginitis: POSITIVE — AB
Chlamydia: NEGATIVE
Comment: NEGATIVE
Comment: NEGATIVE
Comment: NEGATIVE
Comment: NEGATIVE
Comment: NEGATIVE
Comment: NORMAL
Neisseria Gonorrhea: NEGATIVE
Trichomonas: POSITIVE — AB

## 2023-01-15 MED ORDER — METRONIDAZOLE 500 MG PO TABS
500.0000 mg | ORAL_TABLET | Freq: Two times a day (BID) | ORAL | 0 refills | Status: DC
  Filled 2023-01-15: qty 14, 7d supply, fill #0

## 2023-01-15 MED ORDER — FLUCONAZOLE 150 MG PO TABS
150.0000 mg | ORAL_TABLET | Freq: Once | ORAL | 0 refills | Status: AC
  Filled 2023-01-15: qty 2, 3d supply, fill #0

## 2023-01-17 ENCOUNTER — Other Ambulatory Visit (HOSPITAL_COMMUNITY): Payer: Self-pay

## 2023-01-19 ENCOUNTER — Other Ambulatory Visit (HOSPITAL_COMMUNITY): Payer: Self-pay

## 2023-01-22 ENCOUNTER — Encounter (HOSPITAL_COMMUNITY): Payer: Self-pay

## 2023-01-22 ENCOUNTER — Other Ambulatory Visit (HOSPITAL_COMMUNITY): Payer: Self-pay

## 2023-01-23 ENCOUNTER — Ambulatory Visit: Payer: 59 | Admitting: Neurology

## 2023-01-23 ENCOUNTER — Encounter: Payer: Self-pay | Admitting: Neurology

## 2023-01-23 NOTE — Progress Notes (Deleted)
Patient: Amy Thomas Date of Birth: 15-Nov-1980  Reason for Visit: Follow up History from: Patient Primary Neurologist: [facility]  ASSESSMENT AND PLAN 42 y.o. year old female   1.  Chronic migraine headache 2.  Chronic insomnia, anxiety 3.  Right-sided neck pain  -Doing significantly better, headaches under excellent control -Continue Emgality for migraine preventative, also taking nortriptyline 75 mg tolerating this well (she increased on her own), if her migraines remain under excellent control may consider reducing the dose of nortriptyline -Continue Maxalt as needed for acute headache -Plan to repeat MRI of the brain in 6 months, mention 6 mm right temporal occipital focus of nonspecific T2 hyperintensity -Previously tried and failed amitriptyline, Effexor, Aimovig, Ajovy (but did CGRP for < 2 months), Imitrex, Maxalt, Relpax -She is decided not to proceed with sleep study  No orders of the defined types were placed in this encounter.   HISTORY  Amy Thomas is a 42 year old female, seen in request by her primary care nurse practitioner Maurie Boettcher T for evaluation of migraine headache, initial evaluation was on June 17, 2020.   I reviewed and summarized the referring note.  Past medical history Crohn's disease, receiving Humira treatment,   Reported history of migraine headaches since 2011, her headache started from right upper neck, occipital region, spreading forward, subtle behind the right eye, with light noise smell sensitivity, lasting for few days, previously tried Imitrex as needed was helpful, but even with triptan treatment, her headaches sometimes last 3 to 5 days, she complains of worsening headache since September 2021, has to use FMLA leave,    She was under neurologist Dr. Anne Hahn care in the past, amitriptyline 50 mg every night as preventive medication initially was helpful, eventually lost its benefit, recently tried 2 months of  Ajovy subcutaneous injection from primary care doctor, without helping her migraine   She is now having migraine 2-3 times a week, used up her 9 tablets of Imitrex 100 mg every month, use frequent Goody powders, 3 times a week, sometimes twice a day, this has caused her worsening GI symptoms.   UPDATE Jul 21 2021: She lost to follow-up since December 2021, tried aimovig 70 subcutaneous for 3 months without significant benefit, Effexor XR was helping her headache, but she complains of increased difficulty, despite them trying adjusting schedule from nighttime to daytime, eventually stopped taking her medications  She has been on a preventive medication from many months complains of increased headache, especially since December 2022, daily headache, she is taking daily BC powder, history of Crohn's, does cause some side effect  In addition, she complains of increased insomnia, frequent awakening at nighttime, mild snoring, excessive daytime sleepiness, fatigue, early morning dry mouth, frequent morning headaches  Most of her migraine started from right cervical region, spreading to settle behind right retro-orbital, light noise sensitivity, nauseous, lasting for few hours, Maxalt works well as abortive medications  Update February 09, 2022 SS: didn't try Emgality, didn't think she would notice any benefit since Ajovy and Aimovig didn't work. On nortriptyline 50 mg at bedtime. Headaches are right neck area, radiating up. When ran out of nortriptyline, more neck pain. She wants an MRI of cervical spine, feels when she turns her head, "it catches". No numbness/tingling/weakness or arms or legs. Few nights ago, waking up every hour with neck pain. Gets 12 maxalt tablets a month. Migraines don't always start with neck pain, at the vertex, travels forward. Has crohn's. Has tried relpax, imitrex.  Is  tearful today. Never heard from sleep consult,chronic insomnia, snoring, daytime drowsiness.  Update June 28, 2022 SS: MRI cervical spine 02/28/22 was unremarkable.  MRI of the brain without contrast showed 6 mm right temporal occipital focus of nonspecific T2 hyperintensity.  Had sleep consult with Dr. Frances Furbish 03/21/22 sleep study was ordered, she held off on this. Has been Emgality since August. Only 3 migraines since last seen. Taking nortriptyline 75 mg at bedtime. Only took Nurtec a few times, unsure if helpful? Sleeping well. Going through a divorce.   Update January 23, 2023 SS:   REVIEW OF SYSTEMS: Out of a complete 14 system review of symptoms, the patient complains only of the following symptoms, and all other reviewed systems are negative.  See HPI  ALLERGIES: Allergies  Allergen Reactions   Cefaclor Shortness Of Breath    HOME MEDICATIONS: Outpatient Medications Prior to Visit  Medication Sig Dispense Refill   metroNIDAZOLE (FLAGYL) 500 MG tablet Take 1 tablet (500 mg total) by mouth 2 (two) times daily. 14 tablet 0   Adalimumab (HUMIRA, 2 PEN,) 40 MG/0.8ML PNKT Inject 1 Pen into the skin every 14 (fourteen) days. 2 each 6   BIOTIN PO Take by mouth.     Ferrous Sulfate (IRON SUPPLEMENT PO) Take 2 tablets by mouth daily.     Galcanezumab-gnlm (EMGALITY) 120 MG/ML SOAJ Inject 120 mg into the skin every 30 (thirty) days. 1 mL 11   influenza vac split quadrivalent PF (FLUARIX QUADRIVALENT) 0.5 ML injection Inject into the muscle. 0.5 mL 0   levonorgestrel (MIRENA) 20 MCG/24HR IUD 1 each by Intrauterine route once.     losartan (COZAAR) 50 MG tablet Take 1 tablet (50 mg total) by mouth daily. 90 tablet 1   metroNIDAZOLE (METROGEL) 0.75 % vaginal gel Insert 5 grams vaginally daily for 10 days, then twice weekly for 16 weeks (Patient taking differently: Place vaginally as needed.) 70 g 3   nortriptyline (PAMELOR) 25 MG capsule Take 3 capsules (75 mg total) by mouth at bedtime. 90 capsule 11   ondansetron (ZOFRAN-ODT) 4 MG disintegrating tablet Take 1 tablet (4 mg total) by mouth every 8 (eight)  hours as needed for nausea or vomiting. 20 tablet 6   ondansetron (ZOFRAN-ODT) 8 MG disintegrating tablet Take 1 tablet by mouth every 8 hours as needed for nausea 10 tablet 0   phentermine 37.5 MG capsule Take 1 capsule (37.5 mg total) by mouth every morning. 30 capsule 0   Rimegepant Sulfate (NURTEC) 75 MG TBDP Take 75 mg by mouth as needed. 8 tablet 11   rizatriptan (MAXALT-MLT) 10 MG disintegrating tablet Take 1 tablet (10 mg total) by mouth as needed. May repeat in 2 hours if needed 12 tablet 11   tiZANidine (ZANAFLEX) 4 MG tablet Take 1 tablet (4 mg total) by mouth every 6 (six) hours as needed for muscle spasms. 20 tablet 2   No facility-administered medications prior to visit.    PAST MEDICAL HISTORY: Past Medical History:  Diagnosis Date   Anxiety    Blood transfusion without reported diagnosis    Chronic headaches    Colon polyp    Crohn's disease (HCC)    Depression    GERD (gastroesophageal reflux disease)    Heart murmur    Hypertension    Migraine    Neck pain     PAST SURGICAL HISTORY: Past Surgical History:  Procedure Laterality Date   CERVICAL BIOPSY  W/ LOOP ELECTRODE EXCISION     to remove  precancous cells    COLONOSCOPY  2017   [facility]   LYMPHADENECTOMY Left    TONSILLECTOMY AND ADENOIDECTOMY     TONSILLECTOMY AND ADENOIDECTOMY      FAMILY HISTORY: Family History  Problem Relation Age of Onset   Ovarian cancer Mother    Migraines Mother    Other Father        no medical history of father   Stomach cancer Maternal Uncle    Asthma Maternal Grandmother    COPD Maternal Grandmother    Hyperlipidemia Maternal Grandmother    Hypertension Maternal Grandmother    Stroke Maternal Grandmother    Uterine cancer Maternal Grandmother    Heart disease Maternal Grandmother    Diabetes Maternal Grandmother    Colon cancer Neg Hx    Esophageal cancer Neg Hx    Sleep apnea Neg Hx     SOCIAL HISTORY: Social History   Socioeconomic History    Marital status: Significant Other    Spouse name: Not on file   Number of children: 2   Years of education: college   Highest education level: Not on file  Occupational History   Occupation: X-Ray Tech  Tobacco Use   Smoking status: Former    Current packs/day: 0.00    Types: Cigarettes    Quit date: 03/10/2014    Years since quitting: 8.8   Smokeless tobacco: Never  Vaping Use   Vaping status: Never Used  Substance and Sexual Activity   Alcohol use: Yes    Comment: occasionally   Drug use: Not Currently    Types: Marijuana   Sexual activity: Yes  Other Topics Concern   Not on file  Social History Narrative   Lives with husband, son and daughter.   Right-handed.   Caffeine use: two cups per day.   Social Determinants of Health   Financial Resource Strain: Not on file  Food Insecurity: Not on file  Transportation Needs: Not on file  Physical Activity: Not on file  Stress: Not on file  Social Connections: Unknown (11/21/2021)   [facility], [facility]   Social Network    Social Network: Not on file  Intimate Partner Violence: Unknown (10/13/2021)   Received from [facility], [facility]   HITS    Physically Hurt: Not on file    Insult or Talk Down To: Not on file    Threaten Physical Harm: Not on file    Scream or Curse: Not on file    PHYSICAL EXAM  There were no vitals filed for this visit.  There is no height or weight on file to calculate BMI.  Generalized: Well developed, in no acute distress  Neurological examination  Mentation: Alert oriented to time, place, history taking. Follows all commands speech and language fluent Cranial nerve II-XII: Pupils were equal round reactive to light. Extraocular movements were full, visual field were full on confrontational test. Facial sensation and strength were normal.  Head turning and shoulder shrug  were normal and symmetric. Motor: The motor testing reveals 5 over 5 strength of all 4  extremities. Good symmetric motor tone is noted throughout.  Sensory: Sensory testing is intact to soft touch on all 4 extremities. No evidence of extinction is noted.  Coordination: Cerebellar testing reveals good finger-nose-finger and heel-to-shin bilaterally.  Gait and station: Gait is normal.  Reflexes: Deep tendon reflexes are symmetric and normal bilaterally.   DIAGNOSTIC DATA (LABS, IMAGING, TESTING) - I reviewed patient records, labs,  notes, testing and imaging myself where available.  Lab Results  Component Value Date   WBC 6.1 07/18/2022   HGB 11.6 (L) 07/18/2022   HCT 35.8 (L) 07/18/2022   MCV 78.3 07/18/2022   PLT 307.0 07/18/2022      Component Value Date/Time   NA 137 11/23/2021 1523   K 3.9 11/23/2021 1523   CL 101 11/23/2021 1523   CO2 27 11/23/2021 1523   GLUCOSE 82 11/23/2021 1523   BUN 13 11/23/2021 1523   CREATININE 0.70 11/23/2021 1523   CALCIUM 9.7 11/23/2021 1523   PROT 8.0 11/23/2021 1523   ALBUMIN 4.8 11/23/2021 1523   AST 16 11/23/2021 1523   ALT 17 11/23/2021 1523   ALKPHOS 37 (L) 11/23/2021 1523   BILITOT 0.4 11/23/2021 1523   Lab Results  Component Value Date   CHOL 200 11/23/2021   HDL 65.60 11/23/2021   LDLCALC 116 (H) 11/23/2021   TRIG 93.0 11/23/2021   CHOLHDL 3 11/23/2021   No results found for: "HGBA1C" Lab Results  Component Value Date   VITAMINB12 285 03/10/2020   Lab Results  Component Value Date   TSH 0.38 11/23/2021    Margie Ege, AGNP-C, DNP 01/23/2023, 5:55 AM [facility] [address] [place], [place] [address] [phone]

## 2023-02-05 ENCOUNTER — Other Ambulatory Visit (HOSPITAL_BASED_OUTPATIENT_CLINIC_OR_DEPARTMENT_OTHER): Payer: Self-pay

## 2023-02-12 ENCOUNTER — Other Ambulatory Visit (HOSPITAL_BASED_OUTPATIENT_CLINIC_OR_DEPARTMENT_OTHER): Payer: Self-pay

## 2023-02-14 ENCOUNTER — Other Ambulatory Visit (HOSPITAL_BASED_OUTPATIENT_CLINIC_OR_DEPARTMENT_OTHER): Payer: Self-pay

## 2023-02-27 ENCOUNTER — Other Ambulatory Visit (HOSPITAL_COMMUNITY): Payer: Self-pay

## 2023-02-27 ENCOUNTER — Other Ambulatory Visit: Payer: Self-pay

## 2023-03-20 ENCOUNTER — Other Ambulatory Visit (HOSPITAL_COMMUNITY): Payer: Self-pay

## 2023-03-29 ENCOUNTER — Other Ambulatory Visit (HOSPITAL_COMMUNITY): Payer: Self-pay

## 2023-03-31 ENCOUNTER — Encounter (HOSPITAL_COMMUNITY): Payer: Self-pay

## 2023-04-18 ENCOUNTER — Encounter: Payer: Self-pay | Admitting: Gastroenterology

## 2023-04-25 ENCOUNTER — Other Ambulatory Visit (HOSPITAL_COMMUNITY): Payer: Self-pay

## 2023-04-27 ENCOUNTER — Other Ambulatory Visit (HOSPITAL_COMMUNITY): Payer: Self-pay

## 2023-04-27 ENCOUNTER — Encounter (HOSPITAL_COMMUNITY): Payer: Self-pay

## 2023-04-30 ENCOUNTER — Other Ambulatory Visit: Payer: Self-pay

## 2023-05-07 ENCOUNTER — Other Ambulatory Visit: Payer: Self-pay

## 2023-05-11 ENCOUNTER — Other Ambulatory Visit: Payer: Self-pay

## 2023-06-13 ENCOUNTER — Other Ambulatory Visit: Payer: Self-pay

## 2023-06-13 ENCOUNTER — Encounter (HOSPITAL_COMMUNITY): Payer: Self-pay

## 2023-06-13 NOTE — Progress Notes (Signed)
Specialty Pharmacy Ongoing Clinical Assessment Note  Amy Thomas is a 42 y.o. female who is being followed by the specialty pharmacy service for RxSp Crohn's Disease   Patient's specialty medication(s) reviewed today: Adalimumab   Missed doses in the last 4 weeks: 1   Patient/Caregiver did not have any additional questions or concerns.   Therapeutic benefit summary: Patient is achieving benefit   Adverse events/side effects summary: Experienced adverse events/side effects (burning sensation and injection site reaction)   Patient's therapy is appropriate to: Other    Goals Addressed             This Visit's Progress    Minimize recurrence of flares       Patient is not on track and worsening. Patient will work on increased adherence by switching to Citrate Free Humira and using Mychart Questionnaire to request refills for Humira.             Follow up:  3 months  Bobette Mo Specialty Pharmacist

## 2023-06-13 NOTE — Progress Notes (Signed)
Specialty Pharmacy Refill Coordination Note  Amy Thomas is a 42 y.o. female contacted today regarding refills of specialty medication(s) Adalimumab   Patient requested Delivery   Delivery date: 06/15/23   Verified address: Patient address [address]  [place] [address]   Medication will be filled on 06/14/23.

## 2023-06-13 NOTE — Progress Notes (Signed)
Clinical Intervention Note  Clinical Intervention Notes: Patient reports burning at the injection site with Humira. She also reports she had an injection site reaction the next day after her last injection that was concerning. Patient is going to work with her prescriber to filll out a medwatch report to work on obtaining a Citrate Free Humira approval from her insurance (previous PA was denied). Specialty Pharmacy SPPA Coordinator is aware.   Clinical Intervention Outcomes: Improved therapy adherence   Bobette Mo Specialty Pharmacist

## 2023-06-14 ENCOUNTER — Other Ambulatory Visit: Payer: Self-pay

## 2023-06-28 ENCOUNTER — Other Ambulatory Visit: Payer: Self-pay

## 2023-06-28 NOTE — Progress Notes (Signed)
Specialty Pharmacy Refill Coordination Note  Nakylah Gritter is a 42 y.o. female contacted today regarding refills of specialty medication(s) Adalimumab (Humira (2 Pen))   Patient requested Delivery   Delivery date: 07/13/23   Verified address: Patient address [address]  [place] [address]   Medication will be filled on 07/12/23.

## 2023-07-12 ENCOUNTER — Telehealth: Payer: Self-pay | Admitting: Pharmacy Technician

## 2023-07-12 ENCOUNTER — Other Ambulatory Visit (HOSPITAL_COMMUNITY): Payer: Self-pay

## 2023-07-12 ENCOUNTER — Telehealth: Payer: 59 | Admitting: Family Medicine

## 2023-07-12 ENCOUNTER — Other Ambulatory Visit: Payer: Self-pay

## 2023-07-12 DIAGNOSIS — B9689 Other specified bacterial agents as the cause of diseases classified elsewhere: Secondary | ICD-10-CM

## 2023-07-12 DIAGNOSIS — J019 Acute sinusitis, unspecified: Secondary | ICD-10-CM | POA: Diagnosis not present

## 2023-07-12 NOTE — Telephone Encounter (Signed)
 Pharmacy Patient Advocate Encounter   Received notification from Patient Pharmacy that prior authorization for Humira  is due for renewal.   Insurance verification completed.   The patient is insured through [employer] [facility] [facility].  Action: Patient hasn't been seen in your office in over a year. Plan requires updated chart notes for PA renewal.  Please Advise.

## 2023-07-12 NOTE — Telephone Encounter (Signed)
Left message for patient to callback to schedule OV. °

## 2023-07-13 ENCOUNTER — Other Ambulatory Visit (HOSPITAL_BASED_OUTPATIENT_CLINIC_OR_DEPARTMENT_OTHER): Payer: Self-pay

## 2023-07-13 MED ORDER — DOXYCYCLINE HYCLATE 100 MG PO TABS
100.0000 mg | ORAL_TABLET | Freq: Two times a day (BID) | ORAL | 0 refills | Status: AC
  Filled 2023-07-13: qty 14, 7d supply, fill #0

## 2023-07-13 NOTE — Progress Notes (Signed)

## 2023-07-13 NOTE — Telephone Encounter (Signed)
 My chart message sent as well

## 2023-07-13 NOTE — Telephone Encounter (Signed)
 Left message for patient to call back

## 2023-07-16 ENCOUNTER — Other Ambulatory Visit: Payer: Self-pay

## 2023-07-16 NOTE — Telephone Encounter (Signed)
 Left message for pt to call back

## 2023-07-17 NOTE — Telephone Encounter (Signed)
 Left message for pt to call back

## 2023-07-18 ENCOUNTER — Ambulatory Visit: Payer: 59 | Admitting: Family Medicine

## 2023-07-18 ENCOUNTER — Encounter: Payer: Self-pay | Admitting: Family Medicine

## 2023-07-18 VITALS — BP 125/96 | HR 102 | Ht 67.0 in | Wt 156.0 lb

## 2023-07-18 DIAGNOSIS — Z114 Encounter for screening for human immunodeficiency virus [HIV]: Secondary | ICD-10-CM

## 2023-07-18 DIAGNOSIS — D509 Iron deficiency anemia, unspecified: Secondary | ICD-10-CM

## 2023-07-18 DIAGNOSIS — Z0000 Encounter for general adult medical examination without abnormal findings: Secondary | ICD-10-CM | POA: Diagnosis not present

## 2023-07-18 DIAGNOSIS — I10 Essential (primary) hypertension: Secondary | ICD-10-CM

## 2023-07-18 LAB — COMPREHENSIVE METABOLIC PANEL
ALT: 18 U/L (ref 0–35)
AST: 16 U/L (ref 0–37)
Albumin: 4.3 g/dL (ref 3.5–5.2)
Alkaline Phosphatase: 52 U/L (ref 39–117)
BUN: 16 mg/dL (ref 6–23)
CO2: 27 meq/L (ref 19–32)
Calcium: 9.2 mg/dL (ref 8.4–10.5)
Chloride: 102 meq/L (ref 96–112)
Creatinine, Ser: 0.65 mg/dL (ref 0.40–1.20)
GFR: 108.01 mL/min (ref >60.00)
Glucose, Bld: 89 mg/dL (ref 70–99)
Potassium: 4.1 meq/L (ref 3.5–5.1)
Sodium: 137 meq/L (ref 135–145)
Total Bilirubin: 0.3 mg/dL (ref 0.2–1.2)
Total Protein: 7.7 g/dL (ref 6.0–8.3)

## 2023-07-18 LAB — LIPID PANEL
Cholesterol: 184 mg/dL (ref 0–200)
HDL: 51 mg/dL (ref >39.00)
LDL Cholesterol: 118 mg/dL — ABNORMAL HIGH (ref 0–99)
NonHDL: 133.27
Total CHOL/HDL Ratio: 4
Triglycerides: 76 mg/dL (ref 0.0–149.0)
VLDL: 15.2 mg/dL (ref 0.0–40.0)

## 2023-07-18 LAB — CBC WITH DIFFERENTIAL/PLATELET
Basophils Absolute: 0 10*3/uL (ref 0.0–0.1)
Basophils Relative: 0.4 % (ref 0.0–3.0)
Eosinophils Absolute: 0.1 10*3/uL (ref 0.0–0.7)
Eosinophils Relative: 2.3 % (ref 0.0–5.0)
HCT: 31.4 % — ABNORMAL LOW (ref 36.0–46.0)
Hemoglobin: 9.6 g/dL — ABNORMAL LOW (ref 12.0–15.0)
Lymphocytes Relative: 23.6 % (ref 12.0–46.0)
Lymphs Abs: 1.5 10*3/uL (ref 0.7–4.0)
MCHC: 30.5 g/dL (ref 30.0–36.0)
MCV: 66.9 fL — ABNORMAL LOW (ref 78.0–100.0)
Monocytes Absolute: 0.5 10*3/uL (ref 0.1–1.0)
Monocytes Relative: 8.6 % (ref 3.0–12.0)
Neutro Abs: 4.1 10*3/uL (ref 1.4–7.7)
Neutrophils Relative %: 65.1 % (ref 43.0–77.0)
Platelets: 424 10*3/uL — ABNORMAL HIGH (ref 150.0–400.0)
RBC: 4.69 Mil/uL (ref 3.87–5.11)
RDW: 19.5 % — ABNORMAL HIGH (ref 11.5–15.5)
WBC: 6.3 10*3/uL (ref 4.0–10.5)

## 2023-07-18 LAB — IBC + FERRITIN
Ferritin: 2.6 ng/mL — ABNORMAL LOW (ref 10.0–291.0)
Iron: 15 ug/dL — ABNORMAL LOW (ref 42–145)
Saturation Ratios: 3 % — ABNORMAL LOW (ref 20.0–50.0)
TIBC: 497 ug/dL — ABNORMAL HIGH (ref 250.0–450.0)
Transferrin: 355 mg/dL (ref 212.0–360.0)

## 2023-07-18 LAB — TSH: TSH: 0.35 u[IU]/mL (ref 0.35–5.50)

## 2023-07-18 NOTE — Addendum Note (Signed)
 Addended by: Hyman Hopes B on: 07/18/2023 04:40 PM   Modules accepted: Orders

## 2023-07-18 NOTE — Progress Notes (Signed)
 Complete physical exam  Patient: Amy Thomas   DOB: 07-12-1980   43 y.o. Female  MRN: [id number]  Subjective:    Chief Complaint  Patient presents with   Annual Exam    Amy Thomas is a 43 y.o. female who presents today for a complete physical exam. She reports consuming a general diet. The patient has a physically strenuous job, but has no regular exercise apart from work.  She generally feels well. She reports sleeping well. She does not have additional problems to discuss today.   Currently lives with: son and daughter  Acute concerns or interim problems since last visit: no  Vision concerns: no Dental concerns: no STD concerns: no    Females:  She is currently  sexually active  Contraception choices are: Mirena 2019~ LMP: 06/23/23      Most recent fall risk assessment:    07/18/2023   10:00 AM  Fall Risk   Falls in the past year? 0  Number falls in past yr: 0  Injury with Fall? 0  Risk for fall due to : No Fall Risks  Follow up Falls evaluation completed     Most recent depression screenings:    07/18/2023   10:00 AM 11/23/2021    2:22 PM  PHQ 2/9 Scores  PHQ - 2 Score 0 0            Patient Care Team: Almarie Waddell NOVAK, NP as PCP - General (Family Medicine)   Outpatient Medications Prior to Visit  Medication Sig   adalimumab  (HUMIRA , 2 PEN,) 40 MG/0.8ML AJKT pen Inject 1 Pen into the skin every 14 (fourteen) days.   BIOTIN PO Take by mouth.   doxycycline  (VIBRA -TABS) 100 MG tablet Take 1 tablet (100 mg total) by mouth 2 (two) times daily for 7 days.   Ferrous Sulfate (IRON SUPPLEMENT PO) Take 2 tablets by mouth daily.   levonorgestrel (MIRENA) 20 MCG/24HR IUD 1 each by Intrauterine route once.   ondansetron  (ZOFRAN -ODT) 8 MG disintegrating tablet Take 1 tablet by mouth every 8 hours as needed for nausea   Rimegepant Sulfate  (NURTEC) 75 MG TBDP Take 75 mg by mouth as needed.   rizatriptan  (MAXALT -MLT) 10 MG disintegrating  tablet Take 1 tablet (10 mg total) by mouth as needed. May repeat in 2 hours if needed   tiZANidine  (ZANAFLEX ) 4 MG tablet Take 1 tablet (4 mg total) by mouth every 6 (six) hours as needed for muscle spasms.   losartan  (COZAAR ) 50 MG tablet Take 1 tablet (50 mg total) by mouth daily. (Patient not taking: Reported on 07/18/2023)   [DISCONTINUED] Galcanezumab -gnlm (EMGALITY ) 120 MG/ML SOAJ Inject 120 mg into the skin every 30 (thirty) days.   [DISCONTINUED] influenza vac split quadrivalent PF (FLUARIX  QUADRIVALENT) 0.5 ML injection Inject into the muscle.   [DISCONTINUED] metroNIDAZOLE  (FLAGYL ) 500 MG tablet Take 1 tablet (500 mg total) by mouth 2 (two) times daily.   [DISCONTINUED] metroNIDAZOLE  (METROGEL ) 0.75 % vaginal gel Insert 5 grams vaginally daily for 10 days, then twice weekly for 16 weeks (Patient taking differently: Place vaginally as needed.)   [DISCONTINUED] nortriptyline  (PAMELOR ) 25 MG capsule Take 3 capsules (75 mg total) by mouth at bedtime.   [DISCONTINUED] ondansetron  (ZOFRAN -ODT) 4 MG disintegrating tablet Take 1 tablet (4 mg total) by mouth every 8 (eight) hours as needed for nausea or vomiting.   [DISCONTINUED] phentermine  37.5 MG capsule Take 1 capsule (37.5 mg total) by mouth every morning.   No facility-administered medications prior  to visit.    ROS All review of systems negative except what is listed in the HPI       Objective:     BP (!) 125/96   Pulse (!) 102   Ht 5' 7 (1.702 m)   Wt 156 lb (70.8 kg)   SpO2 100%   BMI 24.43 kg/m    Physical Exam Vitals reviewed.  Constitutional:      General: She is not in acute distress.    Appearance: Normal appearance. She is not ill-appearing.  HENT:     Head: Normocephalic and atraumatic.     Right Ear: Tympanic membrane normal.     Left Ear: Tympanic membrane normal.     Nose: Nose normal.     Mouth/Throat:     Mouth: Mucous membranes are moist.     Pharynx: Oropharynx is clear.  Eyes:     Extraocular  Movements: Extraocular movements intact.     Conjunctiva/sclera: Conjunctivae normal.     Pupils: Pupils are equal, round, and reactive to light.  Cardiovascular:     Rate and Rhythm: Normal rate and regular rhythm.     Pulses: Normal pulses.  Pulmonary:     Effort: Pulmonary effort is normal.     Breath sounds: Normal breath sounds.  Abdominal:     General: Abdomen is flat. Bowel sounds are normal. There is no distension.     Palpations: Abdomen is soft. There is no mass.     Tenderness: There is no abdominal tenderness. There is no right CVA tenderness, left CVA tenderness, guarding or rebound.  Genitourinary:    Comments: Deferred GYN/breast exam  Musculoskeletal:        General: Normal range of motion.     Cervical back: Normal range of motion and neck supple. No tenderness.  Lymphadenopathy:     Cervical: No cervical adenopathy.  Skin:    General: Skin is warm and dry.     Capillary Refill: Capillary refill takes less than 2 seconds.  Neurological:     General: No focal deficit present.     Mental Status: She is alert and oriented to person, place, and time.  Psychiatric:        Mood and Affect: Mood normal.        Behavior: Behavior normal.        Thought Content: Thought content normal.        Judgment: Judgment normal.        No results found for any visits on 07/18/23.     Assessment & Plan:    Routine Health Maintenance and Physical Exam Discussed health promotion and safety including diet and exercise recommendations, dental health, and injury prevention. Tobacco cessation if applicable. Seat belts, sunscreen, smoke detectors, etc.    Immunization History  Administered Date(s) Administered   Influenza-Unspecified 04/25/2018, 05/07/2019   PFIZER(Purple Top)SARS-COV-2 Vaccination 07/12/2019, 08/05/2019   Tdap 01/07/2021    Health Maintenance  Topic Date Due   Colonoscopy  03/11/2023   INFLUENZA VACCINE  10/08/2023 (Originally 02/08/2023)   COVID-19  Vaccine (3 - Pfizer risk series) 07/16/2024 (Originally 09/02/2019)   Cervical Cancer Screening (HPV/Pap Cotest)  10/02/2023   DTaP/Tdap/Td (2 - Td or Tdap) 01/08/2031   Hepatitis C Screening  Completed   HIV Screening  Completed   HPV VACCINES  Aged Out        Problem List Items Addressed This Visit       Active Problems   Hypertension   DBP elevated.  Restart previous losartan  and send list of readings in 2 weeks. Follow-up if not improving.       Anemia   Relevant Orders   IBC + Ferritin   Other Visit Diagnoses       Annual physical exam    -  Primary   Relevant Orders   CBC with Differential/Platelet   Comprehensive metabolic panel   Lipid panel   TSH   IBC + Ferritin   HIV Antibody (routine testing w rflx)     Encounter for screening for HIV       Relevant Orders   HIV Antibody (routine testing w rflx)      Return in about 6 months (around 01/15/2024) for routine follow-up; send list of BP readings in 2-3 weeks.     Waddell KATHEE Mon, NP

## 2023-07-18 NOTE — Assessment & Plan Note (Signed)
 DBP elevated. Restart previous losartan and send list of readings in 2 weeks. Follow-up if not improving.

## 2023-07-18 NOTE — Telephone Encounter (Signed)
 Left message for pt to call back

## 2023-07-19 LAB — HIV ANTIBODY (ROUTINE TESTING W REFLEX): HIV 1&2 Ab, 4th Generation: NONREACTIVE

## 2023-07-19 NOTE — Telephone Encounter (Signed)
 Letter sent to patient.

## 2023-08-04 ENCOUNTER — Other Ambulatory Visit: Payer: Self-pay

## 2023-08-04 ENCOUNTER — Ambulatory Visit
Admission: EM | Admit: 2023-08-04 | Discharge: 2023-08-04 | Disposition: A | Discharge status: Home / Self Care | Payer: 59 | Attending: Family Medicine | Admitting: Family Medicine

## 2023-08-04 DIAGNOSIS — R059 Cough, unspecified: Secondary | ICD-10-CM | POA: Diagnosis not present

## 2023-08-04 DIAGNOSIS — J101 Influenza due to other identified influenza virus with other respiratory manifestations: Secondary | ICD-10-CM | POA: Diagnosis not present

## 2023-08-04 LAB — POC COVID19/FLU A&B COMBO
Covid Antigen, POC: NEGATIVE
Influenza A Antigen, POC: POSITIVE — AB
Influenza B Antigen, POC: NEGATIVE

## 2023-08-04 MED ORDER — OSELTAMIVIR PHOSPHATE 75 MG PO CAPS: 75.0000 mg | ORAL_CAPSULE | Freq: Two times a day (BID) | ORAL | 0 refills | Status: DC

## 2023-08-04 MED ORDER — BENZONATATE 200 MG PO CAPS: 200.0000 mg | ORAL_CAPSULE | Freq: Three times a day (TID) | ORAL | 0 refills | Status: AC | PRN

## 2023-08-04 MED ORDER — PROMETHAZINE-DM 6.25-15 MG/5ML PO SYRP: 5.0000 mL | ORAL_SOLUTION | Freq: Two times a day (BID) | ORAL | 0 refills | Status: DC | PRN

## 2023-08-04 NOTE — ED Triage Notes (Signed)
Pt presents to [facility] with co of itchy eyes, runny nose, ha, fever, cough, congestion since Thursday. Pt reports covid at home neg but the test was expired

## 2023-08-04 NOTE — ED Provider Notes (Signed)
[facility]    CSN: [id number] Arrival date & time: 08/04/23  1208      History   Chief Complaint Chief Complaint  Patient presents with   URI    HPI Amy Thomas is a 43 y.o. female.   HPI 43 year old female presents with itchy eyes, runny nose, headache fever and cough for 2 days.  Past Medical History:  Diagnosis Date   Anxiety    Blood transfusion without reported diagnosis    Chronic headaches    Colon polyp    Crohn's disease (HCC)    Depression    GERD (gastroesophageal reflux disease)    Heart murmur    Hypertension    Migraine    Neck pain     Patient Active Problem List   Diagnosis Date Noted   Overweight (BMI 25.0-29.9) 11/23/2021   Hypertension 10/19/2021   Crohn's disease (HCC) 10/19/2021   Migraine 10/19/2021   Anxiety 07/21/2021   Chronic insomnia 07/21/2021   Obstructive sleep apnea 07/21/2021   Chronic migraine w/o aura w/o status migrainosus, not intractable 06/17/2020   Neck pain 06/17/2020   Anemia 01/23/2018   Crohn's disease of colon with complication (HCC) 01/23/2018    Past Surgical History:  Procedure Laterality Date   CERVICAL BIOPSY  W/ LOOP ELECTRODE EXCISION     to remove precancous cells    COLONOSCOPY  2017   [facility]   LYMPHADENECTOMY Left    TONSILLECTOMY AND ADENOIDECTOMY     TONSILLECTOMY AND ADENOIDECTOMY      OB History   No obstetric history on file.      Home Medications    Prior to Admission medications   Medication Sig Start Date End Date Taking? Authorizing Provider  benzonatate (TESSALON) 200 MG capsule Take 1 capsule (200 mg total) by mouth 3 (three) times daily as needed for up to 7 days. 08/04/23 08/11/23 Yes Trevor Iha, FNP  losartan (COZAAR) 50 MG tablet Take 1 tablet (50 mg total) by mouth daily. 09/13/22  Yes Clayborne Dana, NP  oseltamivir (TAMIFLU) 75 MG capsule Take 1 capsule (75 mg total) by mouth every 12 (twelve) hours. 08/04/23  Yes Trevor Iha, FNP   promethazine-dextromethorphan (PROMETHAZINE-DM) 6.25-15 MG/5ML syrup Take 5 mLs by mouth 2 (two) times daily as needed for cough. 08/04/23  Yes Trevor Iha, FNP  adalimumab (HUMIRA, 2 PEN,) 40 MG/0.8ML AJKT pen Inject 1 Pen into the skin every 14 (fourteen) days. 11/30/22   Quentin Angst, MD  BIOTIN PO Take by mouth.    [provider]  Ferrous Sulfate (IRON SUPPLEMENT PO) Take 2 tablets by mouth daily.    [provider]  levonorgestrel (MIRENA) 20 MCG/24HR IUD 1 each by Intrauterine route once.    [provider]  ondansetron (ZOFRAN-ODT) 8 MG disintegrating tablet Take 1 tablet by mouth every 8 hours as needed for nausea 07/18/22     Rimegepant Sulfate (NURTEC) 75 MG TBDP Take 75 mg by mouth as needed. 02/09/22   Glean Salvo, NP  rizatriptan (MAXALT-MLT) 10 MG disintegrating tablet Take 1 tablet (10 mg total) by mouth as needed. May repeat in 2 hours if needed 06/28/22   Glean Salvo, NP  tiZANidine (ZANAFLEX) 4 MG tablet Take 1 tablet (4 mg total) by mouth every 6 (six) hours as needed for muscle spasms. 06/28/22   Glean Salvo, NP    Family History Family History  Problem Relation Age of Onset   Ovarian cancer Mother  Migraines Mother    Other Father        no medical history of father   Stomach cancer Maternal Uncle    Asthma Maternal Grandmother    COPD Maternal Grandmother    Hyperlipidemia Maternal Grandmother    Hypertension Maternal Grandmother    Stroke Maternal Grandmother    Uterine cancer Maternal Grandmother    Heart disease Maternal Grandmother    Diabetes Maternal Grandmother    Colon cancer Neg Hx    Esophageal cancer Neg Hx    Sleep apnea Neg Hx     Social History Social History   Tobacco Use   Smoking status: Former    Current packs/day: 0.00    Types: Cigarettes    Quit date: 03/10/2014    Years since quitting: 9.4   Smokeless tobacco: Never  Vaping Use   Vaping status: Never Used  Substance Use Topics    Alcohol use: Yes    Comment: occasionally   Drug use: Not Currently    Types: Marijuana     Allergies   Cefaclor   Review of Systems Review of Systems  Constitutional:  Positive for fatigue and fever.  HENT:  Positive for congestion.   Eyes:  Positive for itching.  Musculoskeletal:  Positive for arthralgias and myalgias.  All other systems reviewed and are negative.    Physical Exam Triage Vital Signs ED Triage Vitals  Encounter Vitals Group     BP 08/04/23 1306 125/86     Systolic BP Percentile --      Diastolic BP Percentile --      Pulse Rate 08/04/23 1306 92     Resp 08/04/23 1306 16     Temp 08/04/23 1306 98.5 F (36.9 C)     Temp src --      SpO2 08/04/23 1306 98 %     Weight --      Height --      Head Circumference --      Peak Flow --      Pain Score 08/04/23 1304 0     Pain Loc --      Pain Education --      Exclude from Growth Chart --    No data found.  Updated Vital Signs BP 125/86   Pulse 92   Temp 98.5 F (36.9 C)   Resp 16   LMP 07/20/2023   SpO2 98%    Physical Exam Vitals and nursing note reviewed.  Constitutional:      Appearance: Normal appearance. She is normal weight.  HENT:     Head: Normocephalic and atraumatic.     Right Ear: Tympanic membrane, ear canal and external ear normal.     Left Ear: Tympanic membrane, ear canal and external ear normal.     Mouth/Throat:     Mouth: Mucous membranes are moist.     Pharynx: Oropharynx is clear.  Eyes:     Extraocular Movements: Extraocular movements intact.     Conjunctiva/sclera: Conjunctivae normal.     Pupils: Pupils are equal, round, and reactive to light.  Cardiovascular:     Rate and Rhythm: Normal rate and regular rhythm.     Pulses: Normal pulses.     Heart sounds: Normal heart sounds.  Pulmonary:     Effort: Pulmonary effort is normal.     Breath sounds: Normal breath sounds. No wheezing, rhonchi or rales.  Musculoskeletal:        General: Normal range of motion.  Cervical back: Normal range of motion and neck supple.  Skin:    General: Skin is warm and dry.  Neurological:     General: No focal deficit present.     Mental Status: She is alert and oriented to person, place, and time. Mental status is at baseline.  Psychiatric:        Mood and Affect: Mood normal.        Behavior: Behavior normal.      UC Treatments / Results  Labs (all labs ordered are listed, but only abnormal results are displayed) Labs Reviewed  POC COVID19/FLU A&B COMBO - Abnormal; Notable for the following components:      Result Value   Influenza A Antigen, POC Positive (*)    All other components within normal limits    EKG   Radiology No results found.  Procedures Procedures (including critical care time)  Medications Ordered in UC Medications - No data to display  Initial Impression / Assessment and Plan / UC Course  I have reviewed the triage vital signs and the nursing notes.  Pertinent labs & imaging results that were available during my care of the patient were reviewed by me and considered in my medical decision making (see chart for details).     MDM: 1.  Influenza A-Rx'd Tamiflu 75 mg capsule: Take 1 capsule twice daily x 5 days; 2.  Cough, unspecified type-Rx'd Tessalon 200 mg capsule: Take 1 capsule 3 times daily, as needed for cough, Rx'd Promethazine DM 6.25-teen mg/5 mL syrup: Take 5 mL every 6 hours, as needed for cough. Advised patient to take medication as directed with food to completion.  Advised may use Tessalon capsules daily or as needed for cough.  Advised may use Promethazine DM cough syrup at night for cough prior to sleep due to sedative effects.  Encouraged to increase daily water intake to 64 ounces per day while taking these medications.  Advised if symptoms worsen and/or unresolved please follow-up PCP or here for further evaluation.  Patient discharged home, hemodynamically stable.  Work note provided to patient prior to  discharge. Final Clinical Impressions(s) / UC Diagnoses   Final diagnoses:  Influenza A  Cough, unspecified type     Discharge Instructions      Advised patient to take medication as directed with food to completion.  Advised may use Tessalon capsules daily or as needed for cough.  Advised may use Promethazine DM cough syrup at night for cough prior to sleep due to sedative effects.  Encouraged to increase daily water intake to 64 ounces per day while taking these medications.  Advised if symptoms worsen and/or unresolved please follow-up PCP or here for further evaluation.     ED Prescriptions     Medication Sig Dispense Auth. Provider   oseltamivir (TAMIFLU) 75 MG capsule Take 1 capsule (75 mg total) by mouth every 12 (twelve) hours. 10 capsule Trevor Iha, FNP   benzonatate (TESSALON) 200 MG capsule Take 1 capsule (200 mg total) by mouth 3 (three) times daily as needed for up to 7 days. 40 capsule Trevor Iha, FNP   promethazine-dextromethorphan (PROMETHAZINE-DM) 6.25-15 MG/5ML syrup Take 5 mLs by mouth 2 (two) times daily as needed for cough. 118 mL Trevor Iha, FNP      PDMP not reviewed this encounter.   Trevor Iha, FNP 08/04/23 1416

## 2023-08-04 NOTE — Discharge Instructions (Addendum)
Advised patient to take medication as directed with food to completion.  Advised may use Tessalon capsules daily or as needed for cough.  Advised may use Promethazine DM cough syrup at night for cough prior to sleep due to sedative effects.  Encouraged to increase daily water intake to 64 ounces per day while taking these medications.  Advised if symptoms worsen and/or unresolved please follow-up PCP or here for further evaluation.

## 2023-08-06 ENCOUNTER — Other Ambulatory Visit: Payer: Self-pay

## 2023-08-07 ENCOUNTER — Inpatient Hospital Stay: Payer: 59 | Admitting: Family

## 2023-08-07 ENCOUNTER — Inpatient Hospital Stay: Payer: 59 | Attending: Hematology & Oncology

## 2023-08-19 ENCOUNTER — Other Ambulatory Visit: Payer: Self-pay

## 2023-08-20 ENCOUNTER — Encounter: Payer: Self-pay | Admitting: Hematology & Oncology

## 2023-08-20 ENCOUNTER — Encounter: Payer: Self-pay | Admitting: Family Medicine

## 2023-08-20 ENCOUNTER — Other Ambulatory Visit: Payer: Self-pay

## 2023-08-24 ENCOUNTER — Other Ambulatory Visit: Payer: Self-pay

## 2023-08-27 ENCOUNTER — Other Ambulatory Visit: Payer: Self-pay

## 2023-08-27 NOTE — Progress Notes (Signed)
Specialty Pharmacy Refill Coordination Note  Amy Thomas is a 43 y.o. female contacted today regarding refills of specialty medication(s) Adalimumab (Humira (2 Pen))   Patient requested Delivery   Delivery date: 08/28/23   Verified address: Patient address [address]  [place] [address]   Medication will be filled on 02.17.25. Notify patient if delayed.

## 2023-09-13 ENCOUNTER — Other Ambulatory Visit (HOSPITAL_COMMUNITY): Payer: Self-pay

## 2023-09-18 ENCOUNTER — Other Ambulatory Visit: Payer: Self-pay

## 2023-09-18 NOTE — Progress Notes (Signed)
 Specialty Pharmacy Refill Coordination Note  Amy Thomas is a 43 y.o. female contacted today regarding refills of specialty medication(s) Adalimumab (Humira (2 Pen))   Patient requested Delivery   Delivery date: 09/27/23   Verified address: Patient address [address]  [place] [address]   Medication will be filled on 03.19.25.

## 2023-09-21 ENCOUNTER — Encounter (HOSPITAL_BASED_OUTPATIENT_CLINIC_OR_DEPARTMENT_OTHER): Payer: Self-pay | Admitting: Emergency Medicine

## 2023-09-21 ENCOUNTER — Emergency Department (HOSPITAL_BASED_OUTPATIENT_CLINIC_OR_DEPARTMENT_OTHER)
Admission: EM | Admit: 2023-09-21 | Discharge: 2023-09-21 | Discharge status: Against Medical Advice | Attending: Emergency Medicine | Admitting: Emergency Medicine

## 2023-09-21 ENCOUNTER — Emergency Department (HOSPITAL_BASED_OUTPATIENT_CLINIC_OR_DEPARTMENT_OTHER)

## 2023-09-21 ENCOUNTER — Other Ambulatory Visit: Payer: Self-pay

## 2023-09-21 DIAGNOSIS — Z79899 Other long term (current) drug therapy: Secondary | ICD-10-CM | POA: Insufficient documentation

## 2023-09-21 DIAGNOSIS — R42 Dizziness and giddiness: Secondary | ICD-10-CM | POA: Insufficient documentation

## 2023-09-21 DIAGNOSIS — R079 Chest pain, unspecified: Secondary | ICD-10-CM | POA: Diagnosis not present

## 2023-09-21 DIAGNOSIS — Z5321 Procedure and treatment not carried out due to patient leaving prior to being seen by health care provider: Secondary | ICD-10-CM | POA: Insufficient documentation

## 2023-09-21 DIAGNOSIS — I10 Essential (primary) hypertension: Secondary | ICD-10-CM | POA: Diagnosis not present

## 2023-09-21 LAB — CBC
HCT: 30.1 % — ABNORMAL LOW (ref 36.0–46.0)
Hemoglobin: 8.7 g/dL — ABNORMAL LOW (ref 12.0–15.0)
MCH: 19.9 pg — ABNORMAL LOW (ref 26.0–34.0)
MCHC: 28.9 g/dL — ABNORMAL LOW (ref 30.0–36.0)
MCV: 68.7 fL — ABNORMAL LOW (ref 80.0–100.0)
Platelets: 486 10*3/uL — ABNORMAL HIGH (ref 150–400)
RBC: 4.38 MIL/uL (ref 3.87–5.11)
RDW: 18.8 % — ABNORMAL HIGH (ref 11.5–15.5)
WBC: 7.2 10*3/uL (ref 4.0–10.5)
nRBC: 0 % (ref 0.0–0.2)

## 2023-09-21 LAB — BASIC METABOLIC PANEL
Anion gap: 8 (ref 5–15)
BUN: 19 mg/dL (ref 6–20)
CO2: 23 mmol/L (ref 22–32)
Calcium: 8.9 mg/dL (ref 8.9–10.3)
Chloride: 106 mmol/L (ref 98–111)
Creatinine, Ser: 0.82 mg/dL (ref 0.44–1.00)
GFR, Estimated: >60 mL/min (ref >60)
Glucose, Bld: 99 mg/dL (ref 70–99)
Potassium: 3.4 mmol/L — ABNORMAL LOW (ref 3.5–5.1)
Sodium: 137 mmol/L (ref 135–145)

## 2023-09-21 LAB — TROPONIN I (HIGH SENSITIVITY): Troponin I (High Sensitivity): 2 ng/L (ref <18)

## 2023-09-21 NOTE — ED Triage Notes (Signed)
 Pt with CP x 2 weeks.  Some intermittent dizziness.  No nausea.  Wanted to be checked out as heart disease runs in her family.  Hx of htn and takes Losartan.

## 2023-09-26 ENCOUNTER — Other Ambulatory Visit: Payer: Self-pay

## 2023-10-22 ENCOUNTER — Other Ambulatory Visit: Payer: Self-pay | Admitting: Gastroenterology

## 2023-10-22 ENCOUNTER — Other Ambulatory Visit: Payer: Self-pay

## 2023-10-22 DIAGNOSIS — K50019 Crohn's disease of small intestine with unspecified complications: Secondary | ICD-10-CM

## 2023-10-22 DIAGNOSIS — K50919 Crohn's disease, unspecified, with unspecified complications: Secondary | ICD-10-CM

## 2023-10-22 NOTE — Progress Notes (Signed)
 Specialty Pharmacy Refill Coordination Note  Amy Thomas is a 42 y.o. female contacted today regarding refills of specialty medication(s) Adalimumab (Humira (2 Pen))   Patient requested Delivery   Delivery date: 10/31/23   Verified address: Patient address [address]  [place] [address]   Medication will be filled on 10/30/23, pending refill approval.

## 2023-10-23 ENCOUNTER — Other Ambulatory Visit: Payer: Self-pay

## 2023-10-23 NOTE — Progress Notes (Signed)
 Refill request denied, pt haven't been in office since 2023. Pt is made aware of appointment made on 11/08/23.

## 2023-10-23 NOTE — Telephone Encounter (Signed)
 Pt was made aware of the refill request. Pt last seen in office in 2023. Pt was scheduled for an office visit with Dr. Venice Gillis on 11/08/2023 at 8:50 AM. Pt made aware. Pt verbalized understanding with all questions answered.

## 2023-10-29 ENCOUNTER — Other Ambulatory Visit: Payer: Self-pay | Admitting: Family Medicine

## 2023-10-29 DIAGNOSIS — I10 Essential (primary) hypertension: Secondary | ICD-10-CM

## 2023-10-30 ENCOUNTER — Encounter (HOSPITAL_COMMUNITY): Payer: Self-pay

## 2023-10-30 ENCOUNTER — Other Ambulatory Visit (HOSPITAL_COMMUNITY): Payer: Self-pay

## 2023-11-03 ENCOUNTER — Telehealth: Admitting: Physician Assistant

## 2023-11-03 DIAGNOSIS — R051 Acute cough: Secondary | ICD-10-CM

## 2023-11-03 DIAGNOSIS — J069 Acute upper respiratory infection, unspecified: Secondary | ICD-10-CM | POA: Diagnosis not present

## 2023-11-03 MED ORDER — BENZONATATE 200 MG PO CAPS: 200.0000 mg | ORAL_CAPSULE | Freq: Two times a day (BID) | ORAL | 0 refills | Status: DC | PRN

## 2023-11-03 MED ORDER — AZELASTINE HCL 0.1 % NA SOLN: 2.0000 | Freq: Two times a day (BID) | NASAL | 12 refills | Status: DC

## 2023-11-03 NOTE — Progress Notes (Signed)
 I have spent 5 minutes in review of e-visit questionnaire, review and updating patient chart, medical decision making and response to patient.   Laure Kidney, PA-C

## 2023-11-03 NOTE — Progress Notes (Signed)
 E-Visit for Cough   We are sorry that you are not feeling well.  Here is how we plan to help!  Based on your presentation I believe you most likely have A cough due to allergies.  I recommend that you start the an over-the counter-allergy medication such as Claritin 10 mg or Zyrtec 10 mg daily.     In addition you may use A prescription cough medication called Tessalon  Perles 100mg . You may take 1-2 capsules every 8 hours as needed for your cough.   From your responses in the eVisit questionnaire you describe inflammation in the upper respiratory tract which is causing a significant cough.  This is commonly called Bronchitis and has four common causes:   Allergies Viral Infections Acid Reflux Bacterial Infection Allergies, viruses and acid reflux are treated by controlling symptoms or eliminating the cause. An example might be a cough caused by taking certain blood pressure medications. You stop the cough by changing the medication. Another example might be a cough caused by acid reflux. Controlling the reflux helps control the cough.  USE OF BRONCHODILATOR ("RESCUE") INHALERS: There is a risk from using your bronchodilator too frequently.  The risk is that over-reliance on a medication which only relaxes the muscles surrounding the breathing tubes can reduce the effectiveness of medications prescribed to reduce swelling and congestion of the tubes themselves.  Although you feel brief relief from the bronchodilator inhaler, your asthma may actually be worsening with the tubes becoming more swollen and filled with mucus.  This can delay other crucial treatments, such as oral steroid medications. If you need to use a bronchodilator inhaler daily, several times per day, you should discuss this with your provider.  There are probably better treatments that could be used to keep your asthma under control.     HOME CARE Only take medications as instructed by your medical team. Complete the entire  course of an antibiotic. Drink plenty of fluids and get plenty of rest. Avoid close contacts especially the very young and the elderly Cover your mouth if you cough or cough into your sleeve. Always remember to wash your hands A steam or ultrasonic humidifier can help congestion.   GET HELP RIGHT AWAY IF: You develop worsening fever. You become short of breath You cough up blood. Your symptoms persist after you have completed your treatment plan MAKE SURE YOU  Understand these instructions. Will watch your condition. Will get help right away if you are not doing well or get worse.    Thank you for choosing an e-visit.  Your e-visit answers were reviewed by a board certified advanced clinical practitioner to complete your personal care plan. Depending upon the condition, your plan could have included both over the counter or prescription medications.  Please review your pharmacy choice. Make sure the pharmacy is open so you can pick up prescription now. If there is a problem, you may contact your provider through [employer] and have the prescription routed to another pharmacy.  Your safety is important to us . If you have drug allergies check your prescription carefully.   For the next 24 hours you can use MyChart to ask questions about today's visit, request a non-urgent call back, or ask for a work or school excuse. You will get an email in the next two days asking about your experience. I hope that your e-visit has been valuable and will speed your recovery.

## 2023-11-06 DIAGNOSIS — Z3201 Encounter for pregnancy test, result positive: Secondary | ICD-10-CM | POA: Diagnosis not present

## 2023-11-07 DIAGNOSIS — Z3201 Encounter for pregnancy test, result positive: Secondary | ICD-10-CM | POA: Diagnosis not present

## 2023-11-08 ENCOUNTER — Telehealth: Payer: Self-pay

## 2023-11-08 ENCOUNTER — Ambulatory Visit: Admitting: Gastroenterology

## 2023-11-08 ENCOUNTER — Encounter: Payer: Self-pay | Admitting: Gastroenterology

## 2023-11-08 ENCOUNTER — Other Ambulatory Visit: Payer: Self-pay

## 2023-11-08 VITALS — BP 122/78 | HR 100 | Ht 67.0 in | Wt 159.0 lb

## 2023-11-08 DIAGNOSIS — K50919 Crohn's disease, unspecified, with unspecified complications: Secondary | ICD-10-CM | POA: Diagnosis not present

## 2023-11-08 DIAGNOSIS — K50812 Crohn's disease of both small and large intestine with intestinal obstruction: Secondary | ICD-10-CM | POA: Diagnosis not present

## 2023-11-08 DIAGNOSIS — D259 Leiomyoma of uterus, unspecified: Secondary | ICD-10-CM

## 2023-11-08 DIAGNOSIS — D509 Iron deficiency anemia, unspecified: Secondary | ICD-10-CM

## 2023-11-08 DIAGNOSIS — N920 Excessive and frequent menstruation with regular cycle: Secondary | ICD-10-CM

## 2023-11-08 DIAGNOSIS — O3680X0 Pregnancy with inconclusive fetal viability, not applicable or unspecified: Secondary | ICD-10-CM | POA: Diagnosis not present

## 2023-11-08 DIAGNOSIS — K50019 Crohn's disease of small intestine with unspecified complications: Secondary | ICD-10-CM

## 2023-11-08 NOTE — Patient Instructions (Addendum)
 _______________________________________________________  If your blood pressure at your visit was 140/90 or greater, please contact your primary care physician to follow up on this.  _______________________________________________________  If you are age 44 or older, your body mass index should be between 23-30. Your Body mass index is 24.9 kg/m. If this is out of the aforementioned range listed, please consider follow up with your Primary Care Provider.  If you are age 43 or younger, your body mass index should be between 19-25. Your Body mass index is 24.9 kg/m. If this is out of the aformentioned range listed, please consider follow up with your Primary Care Provider.   ________________________________________________________  The [facility] providers would like to encourage you to use MYCHART to communicate with providers for non-urgent requests or questions.  Due to long hold times on the telephone, sending your provider a message by [facility] may be a faster and more efficient way to get a response.  Please allow 48 business hours for a response.  Please remember that this is for non-urgent requests.  _______________________________________________________  We have given requisition forms for you to take with you.   Please call us  in 2 weeks regarding your humira  if you haven't heard anything by the nurse   You have been scheduled for an appointment with Dr. Venice Gillis on 02-07-24 at 830am . Please arrive 10 minutes early for your appointment.  Thank you,  Dr. Lajuan Pila

## 2023-11-08 NOTE — Telephone Encounter (Signed)
-----   Message from [facility] [place] [facility] sent at 11/08/2023 10:25 AM EDT ----- Regarding: Humira  Brand Name Hey can you see about this patient getting the brand name Humira  please per Dr Venice Gillis. Thank you

## 2023-11-08 NOTE — Progress Notes (Signed)
 Chief Complaint: FU  Referring Provider:  Everlina Hock, NP      ASSESSMENT AND PLAN;   #1. 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  Rectal involvement but no perianal Crohn's. On Humira  since 04/2020.  No obstructive symptoms.  #2. IDA d/t Crohn's and menorrhagia d/t uterine fibroids  #3. +preg test  Plan: -Continue Humira  40mg  SQ Q2 weekly (NAME BRAND PLEASE) -Humira  level and Ab, B12, CBC (give order for CBC and B12- she is having blood tests today at obgyn) -Follow-up with OB/GYN as scheduled. -FU in 12 weeks.      HPI:    Amy Thomas is a 43 y.o. 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  No further constipation.  No melena or hematochezia.  No nausea/vomiting.  Of note that she never had diarrhea.   Found out she has +ve pregnancy test recently (5 weeks) followed by Dr Lake Pilgrim  Discussed the use of AI scribe software for clinical note transcription with the patient, who gave verbal consent to proceed.  History of Present Illness Amy Thomas "Amy Thomas" is a 43 year old female who presents with a positive pregnancy test.  She recently discovered she is pregnant at five weeks gestation, confirmed by a positive pregnancy test. She had a Mirena IUD, which was found to be absent during an ultrasound. She is scheduled for further blood work and an ultrasound next week.  She has a history of Crohn's disease and is currently on Humira , which helps maintain regular bowel movements of one to two per day without blood, except when taking 'goodies' for headaches. She has a history of a stricture that showed improvement on a previous colonoscopy and did not require surgery.  She has a history of anemia, previously attributed to heavy menstrual cycles. Her hemoglobin was recently low, leading her to increase her iron supplement intake to two tablets daily. No current ice  cravings, which she associates with low iron levels, and she has not received IV iron therapy  A past finding of a 7 mm lesion in the liver, identified as a hemangioma, is noted, with no current symptoms reported.     Wt Readings from Last 3 Encounters:  11/08/23 159 lb (72.1 kg)  07/18/23 156 lb (70.8 kg)  01/10/23 163 lb (73.9 kg)     From previous notes:  -dx ileoceal Crohns 2017 on colon (Dr Onesimo Bijou), req steroids and antibiotics and lialda. Has had intermittent symptoms since age 55 (when she was pregnant), then did good for 2 yrs thereafter. At age [age], adm to [facility] with PSBO. Treated with IV steroids.  She did not want to take Biologics at that time since she did not have any insurance.  She continued on Lialda. -Normally has some constipation with pellet like stools (rare use of enema). Never had diarrhea, melena or hematochezia even during exacerbation.  -Typical flare - RLQ pain with nausea- better with phenergan .  SH - 3 kids 2 daughters - one step(21), son 30  Previous GI procedures: Colonoscopy 03/10/2020 - Crohn's disease involving cecum and rectum. Moderate activity. - Tight cecal stricture at the level of ileocecal valve d/t active Crohn's disease. - Terminal ileal stricture (TI could not be intubated) - Mild sigmoid diverticulosis.  CTE: 03/17/2020 Active Crohn disease involving the terminal ileum. No evidence of fistula, abscess, or bowel obstruction. 4.5 cm posterior uterine fibroid and IUD. Past Medical History:  Diagnosis Date   Anxiety    Blood transfusion without reported diagnosis    Chronic headaches    Colon polyp    Crohn's disease (HCC)    Depression    GERD (gastroesophageal reflux disease)    Heart murmur    Hypertension    Migraine    Neck pain     Past Surgical History:  Procedure Laterality Date   CERVICAL BIOPSY  W/ LOOP ELECTRODE EXCISION     to remove precancous cells    COLONOSCOPY  2017   [facility]    LYMPHADENECTOMY Left    TONSILLECTOMY AND ADENOIDECTOMY     TONSILLECTOMY AND ADENOIDECTOMY      Family History  Problem Relation Age of Onset   Ovarian cancer Mother    Migraines Mother    Other Father        no medical history of father   Stomach cancer Maternal Uncle    Asthma Maternal Grandmother    COPD Maternal Grandmother    Hyperlipidemia Maternal Grandmother    Hypertension Maternal Grandmother    Stroke Maternal Grandmother    Uterine cancer Maternal Grandmother    Heart disease Maternal Grandmother    Diabetes Maternal Grandmother    Colon cancer Neg Hx    Esophageal cancer Neg Hx    Sleep apnea Neg Hx     Social History   Tobacco Use   Smoking status: Former    Current packs/day: 0.00    Types: Cigarettes    Quit date: 03/10/2014    Years since quitting: 9.6   Smokeless tobacco: Never  Vaping Use   Vaping status: Never Used  Substance Use Topics   Alcohol use: Yes    Comment: occasionally   Drug use: Not Currently    Types: Marijuana    Current Outpatient Medications  Medication Sig Dispense Refill   adalimumab  (HUMIRA , 2 PEN,) 40 MG/0.8ML AJKT pen Inject 1 Pen into the skin every 14 (fourteen) days. 2 each 6   Ferrous Sulfate (IRON SUPPLEMENT PO) Take 2 tablets by mouth daily.     losartan  (COZAAR ) 50 MG tablet Take 1 tablet (50 mg total) by mouth daily. 90 tablet 1   azelastine  (ASTELIN ) 0.1 % nasal spray Place 2 sprays into both nostrils 2 (two) times daily. Use in each nostril as directed (Patient not taking: Reported on 11/08/2023) 30 mL 12   benzonatate  (TESSALON ) 200 MG capsule Take 1 capsule (200 mg total) by mouth 2 (two) times daily as needed for cough. (Patient not taking: Reported on 11/08/2023) 20 capsule 0   BIOTIN PO Take by mouth. (Patient not taking: Reported on 11/08/2023)     levonorgestrel (MIRENA) 20 MCG/24HR IUD 1 each by Intrauterine route once. (Patient not taking: Reported on 11/08/2023)     ondansetron  (ZOFRAN -ODT) 8 MG disintegrating  tablet Take 1 tablet by mouth every 8 hours as needed for nausea (Patient not taking: Reported on 11/08/2023) 10 tablet 0   oseltamivir  (TAMIFLU ) 75 MG capsule Take 1 capsule (75 mg total) by mouth every 12 (twelve) hours. 10 capsule 0   promethazine -dextromethorphan (PROMETHAZINE -DM) 6.25-15 MG/5ML syrup Take 5 mLs by mouth 2 (two) times daily as needed for cough. 118 mL 0   Rimegepant Sulfate  (NURTEC) 75 MG TBDP Take 75 mg by mouth as needed. (Patient not taking: Reported on 11/08/2023) 8 tablet 11   rizatriptan  (MAXALT -MLT) 10 MG disintegrating tablet Take 1 tablet (10 mg total) by mouth as needed. May repeat in 2  hours if needed (Patient not taking: Reported on 11/08/2023) 12 tablet 11   tiZANidine  (ZANAFLEX ) 4 MG tablet Take 1 tablet (4 mg total) by mouth every 6 (six) hours as needed for muscle spasms. (Patient not taking: Reported on 11/08/2023) 20 tablet 2   No current facility-administered medications for this visit.    Allergies  Allergen Reactions   Cefaclor Shortness Of Breath    Review of Systems:  neg     Physical Exam:    BP 122/78   Pulse 100   Ht 5\' 7"  (1.702 m)   Wt 159 lb (72.1 kg)   BMI 24.90 kg/m  Wt Readings from Last 3 Encounters:  11/08/23 159 lb (72.1 kg)  07/18/23 156 lb (70.8 kg)  01/10/23 163 lb (73.9 kg)   Constitutional:  Well-developed, in no acute distress. Psychiatric: Normal mood and affect. Behavior is normal. HEENT: Pupils normal.  Conjunctivae are normal. No scleral icterus. Cardiovascular: Normal rate, regular rhythm. No edema Pulmonary/chest: Effort normal and breath sounds normal. No wheezing, rales or rhonchi. Abdominal: Soft, nondistended. Nontender. Bowel sounds active throughout. There are no masses palpable. No hepatomegaly. Neurological: Alert and oriented to person place and time. Skin: Skin is warm and dry. No rashes noted.  Magnus Schuller, MD 11/08/2023, 9:54 AM  Cc: Everlina Hock, NP

## 2023-11-09 ENCOUNTER — Encounter: Payer: Self-pay | Admitting: Pharmacist

## 2023-11-09 ENCOUNTER — Telehealth: Payer: Self-pay

## 2023-11-09 ENCOUNTER — Other Ambulatory Visit: Payer: Self-pay

## 2023-11-09 ENCOUNTER — Other Ambulatory Visit (HOSPITAL_COMMUNITY): Payer: Self-pay

## 2023-11-09 ENCOUNTER — Ambulatory Visit: Attending: Family Medicine | Admitting: Pharmacist

## 2023-11-09 ENCOUNTER — Other Ambulatory Visit: Payer: Self-pay | Admitting: Pharmacist

## 2023-11-09 DIAGNOSIS — Z79899 Other long term (current) drug therapy: Secondary | ICD-10-CM

## 2023-11-09 DIAGNOSIS — K50919 Crohn's disease, unspecified, with unspecified complications: Secondary | ICD-10-CM

## 2023-11-09 DIAGNOSIS — K50019 Crohn's disease of small intestine with unspecified complications: Secondary | ICD-10-CM

## 2023-11-09 LAB — LAB REPORT - SCANNED: EGFR: 103

## 2023-11-09 MED ORDER — HUMIRA (2 PEN) 40 MG/0.8ML ~~LOC~~ AJKT
40.0000 mg | AUTO-INJECTOR | SUBCUTANEOUS | 6 refills | Status: DC
  Filled 2023-11-09: qty 2, 28d supply, fill #0

## 2023-11-09 MED ORDER — HUMIRA (2 PEN) 40 MG/0.8ML ~~LOC~~ AJKT
40.0000 mg | AUTO-INJECTOR | SUBCUTANEOUS | 6 refills | Status: AC
  Filled 2023-11-09 – 2023-11-12 (×2): qty 2, 28d supply, fill #0
  Filled 2023-12-17 – 2023-12-26 (×3): qty 2, 28d supply, fill #1
  Filled 2024-01-16 – 2024-03-05 (×2): qty 2, 28d supply, fill #2
  Filled 2024-03-31: qty 2, 28d supply, fill #3
  Filled 2024-05-02 (×2): qty 2, 28d supply, fill #4
  Filled 2024-05-28 – 2024-06-04 (×2): qty 2, 28d supply, fill #5

## 2023-11-09 NOTE — Telephone Encounter (Signed)
 NO PA required at this time:

## 2023-11-09 NOTE — Telephone Encounter (Signed)
 Pharmacy Patient Advocate Encounter   Received notification from Pt Calls Messages that prior authorization for Humira  (2 Pen) 40 MG/0.8ML Ajkt Pen is required/requested.   Insurance verification completed.   The patient is insured through [facility] .   Per test claim: The current 28 day co-pay is, $5.00.  No PA needed at this time. This test claim was processed through [facility]- copay amounts may vary at other pharmacies due to pharmacy/plan contracts, or as the patient moves through the different stages of their insurance plan.

## 2023-11-09 NOTE — Progress Notes (Signed)
  S: Patient presents for review of their specialty medication therapy.  Patient is currently taking Humira  for Crohn's. Patient is managed by Dr. Venice Gillis for this.   Adherence: confirms.  Efficacy: reports good efficacy with the medication.  Dosing:  Crohn disease: SubQ (may continue aminosalicylates and/or corticosteroids; if necessary, azathioprine , mercaptopurine, or methotrexate may also be continued): Initial: 160 mg (given as four 40 mg injections on day 1 or given as two 40 mg injections per day over 2 consecutive days), then 80 mg 2 weeks later (day 15). Maintenance: 40 mg every other week beginning day 29. Note: Some patients may require 40 mg every week as maintenance therapy Unice Gant 2009).  Dose adjustments: Renal: no dose adjustments (has not been studied) Hepatic: no dose adjustments (has not been studied)  Drug-drug interactions: none identified   Screening: TB test: completed Hepatitis: completed  Monitoring: S/sx of infection: none  CBC: WNL  S/sx of hypersensitivity: none  S/sx of malignancy: none  S/sx of heart failure: none   Other side effects: none   O:   Lab Results  Component Value Date   WBC 7.2 09/21/2023   HGB 8.7 (L) 09/21/2023   HCT 30.1 (L) 09/21/2023   MCV 68.7 (L) 09/21/2023   PLT 486 (H) 09/21/2023      Chemistry      Component Value Date/Time   NA 137 09/21/2023 0050   K 3.4 (L) 09/21/2023 0050   CL 106 09/21/2023 0050   CO2 23 09/21/2023 0050   BUN 19 09/21/2023 0050   CREATININE 0.82 09/21/2023 0050      Component Value Date/Time   CALCIUM 8.9 09/21/2023 0050   ALKPHOS 52 07/18/2023 1011   AST 16 07/18/2023 1011   ALT 18 07/18/2023 1011   BILITOT 0.3 07/18/2023 1011       A/P: 1. Medication review: Patient currently on Humira  for Crohn's. Reviewed the medication with the patient, including the following: Humira  is a TNF blocking agent indicated for ankylosing spondylitis, Crohn's disease, Hidradenitis  suppurativa, psoriatic arthritis, plaque psoriasis, ulcerative colitis, and uveitis. Patient educated on purpose, proper use and potential adverse effects of Humira . Possible adverse effects are increased risk of infections, headache, and injection site reactions. There is the possibility of an increased risk of malignancy but it is not well understood if this increased risk is due to there medication or the disease state. There are rare cases of pancytopenia and aplastic anemia. For SubQ injection at separate sites in the thigh or lower abdomen (avoiding areas within 2 inches of navel); rotate injection sites. May leave at room temperature for ~15 to 30 minutes prior to use; do not remove cap or cover while allowing product to reach room temperature. Do not use if solution is discolored or contains particulate matter. Do not administer to skin which is red, tender, bruised, hard, or that has scars, stretch marks, or psoriasis plaques. Needle cap of the prefilled syringe or needle cover for the adalimumab  pen may contain latex. Prefilled pens and syringes are available for use by patients and the full amount of the syringe should be injected (self-administration); the vial is intended for institutional use only. Vials do not contain a preservative; discard unused portion. No recommendations for any changes at this time.   Marene Shape, PharmD, Becky Bowels, CPP Clinical Pharmacist [facility] [phone]

## 2023-11-09 NOTE — Telephone Encounter (Signed)
 New prescription for Humira  ordered. Will route to PA team as PA is required.

## 2023-11-09 NOTE — Telephone Encounter (Signed)
 Pt is active on mychart. Message sent to pt regarding approval & copay.

## 2023-11-12 ENCOUNTER — Other Ambulatory Visit: Payer: Self-pay | Admitting: Pharmacy Technician

## 2023-11-12 ENCOUNTER — Other Ambulatory Visit: Payer: Self-pay

## 2023-11-12 NOTE — Progress Notes (Signed)
 Specialty Pharmacy Refill Coordination Note  Amy Thomas is a 43 y.o. female contacted today regarding refills of specialty medication(s) Adalimumab  (Humira  (2 Pen))   Patient requested Delivery   Delivery date: 11/22/23   Verified address: [address] [place] [address]   Medication will be filled on 11/21/23.

## 2023-11-13 DIAGNOSIS — O3680X0 Pregnancy with inconclusive fetal viability, not applicable or unspecified: Secondary | ICD-10-CM | POA: Diagnosis not present

## 2023-11-15 DIAGNOSIS — O3680X0 Pregnancy with inconclusive fetal viability, not applicable or unspecified: Secondary | ICD-10-CM | POA: Diagnosis not present

## 2023-11-15 DIAGNOSIS — O3680X9 Pregnancy with inconclusive fetal viability, other fetus: Secondary | ICD-10-CM | POA: Diagnosis not present

## 2023-11-21 ENCOUNTER — Other Ambulatory Visit: Payer: Self-pay

## 2023-11-27 ENCOUNTER — Other Ambulatory Visit: Payer: Self-pay

## 2023-11-29 DIAGNOSIS — O034 Incomplete spontaneous abortion without complication: Secondary | ICD-10-CM | POA: Diagnosis not present

## 2023-12-07 ENCOUNTER — Other Ambulatory Visit: Payer: Self-pay

## 2023-12-17 ENCOUNTER — Other Ambulatory Visit: Payer: Self-pay

## 2023-12-18 ENCOUNTER — Other Ambulatory Visit (HOSPITAL_COMMUNITY): Payer: Self-pay

## 2023-12-18 ENCOUNTER — Telehealth: Payer: Self-pay

## 2023-12-18 NOTE — Telephone Encounter (Signed)
 Pharmacy Patient Advocate Encounter   Received notification from Pt Calls Messages that prior authorization for Humira  (2 Pen) 40MG /0.8ML auto-injector kit is required/requested.   Insurance verification completed.   The patient is insured through [facility] .   Per test claim: Prior Authorization form/request asks a question that requires your assistance. Please see the question below and advise accordingly. The PA will not be submitted until the necessary information is received.

## 2023-12-19 ENCOUNTER — Other Ambulatory Visit: Payer: Self-pay

## 2023-12-19 NOTE — Telephone Encounter (Signed)
 Yes as it is a requirement to process the PA. It will be denied if there is not a form submitted.

## 2023-12-19 NOTE — Telephone Encounter (Signed)
 Hi Amy Thomas, This pt is not new to  Humira . Does the provider still need to submit a Medwatch form? Please advise

## 2023-12-21 ENCOUNTER — Other Ambulatory Visit (HOSPITAL_COMMUNITY): Payer: Self-pay

## 2023-12-21 ENCOUNTER — Other Ambulatory Visit: Payer: Self-pay

## 2023-12-21 NOTE — Telephone Encounter (Signed)
 Pharmacy Patient Advocate Encounter  Received notification from [facility] that Prior Authorization for Humira  (2 Pen) 40MG /0.8ML auto-injector kit has been APPROVED from 12-21-2023 to 12-19-2024   PA #/Case ID/Reference #: [id number]

## 2023-12-21 NOTE — Telephone Encounter (Signed)
 Pharmacy Patient Advocate Encounter   Per test claim: PA required; PA submitted to above mentioned insurance via CoverMyMeds Key/confirmation #/EOC [id number] Status is pending

## 2023-12-21 NOTE — Telephone Encounter (Signed)
 Spoke with Amy Thomas via secure chat to clarify which forms are needed since patient has not had any adverse outcomes from this medication. She is going to submit further notes & update us  on PA status.

## 2023-12-24 ENCOUNTER — Other Ambulatory Visit: Payer: Self-pay

## 2023-12-25 ENCOUNTER — Other Ambulatory Visit (HOSPITAL_COMMUNITY): Payer: Self-pay

## 2023-12-25 ENCOUNTER — Other Ambulatory Visit: Payer: Self-pay

## 2023-12-26 ENCOUNTER — Other Ambulatory Visit: Payer: Self-pay

## 2023-12-26 ENCOUNTER — Other Ambulatory Visit (HOSPITAL_COMMUNITY): Payer: Self-pay

## 2023-12-26 NOTE — Progress Notes (Signed)
 Specialty Pharmacy Refill Coordination Note  MyChart Questionnaire Submission via 10/17/23 patient message.  Amy Thomas is a 42 y.o. female contacted today regarding refills of specialty medication(s) Adalimumab  (Humira  (2 Pen))  Injection date: 12/27/23.   Patient requested: Delivery   Delivery date: 12/27/23   Verified address: [address]  [place] [address]  Medication will be filled on 12/26/23.

## 2024-01-10 ENCOUNTER — Other Ambulatory Visit (HOSPITAL_BASED_OUTPATIENT_CLINIC_OR_DEPARTMENT_OTHER): Payer: Self-pay

## 2024-01-14 ENCOUNTER — Other Ambulatory Visit (HOSPITAL_COMMUNITY): Payer: Self-pay

## 2024-01-16 ENCOUNTER — Other Ambulatory Visit: Payer: Self-pay

## 2024-01-18 ENCOUNTER — Other Ambulatory Visit: Payer: Self-pay

## 2024-02-06 ENCOUNTER — Other Ambulatory Visit (HOSPITAL_COMMUNITY): Payer: Self-pay

## 2024-02-07 ENCOUNTER — Ambulatory Visit: Admitting: Gastroenterology

## 2024-03-05 ENCOUNTER — Other Ambulatory Visit: Payer: Self-pay

## 2024-03-05 ENCOUNTER — Other Ambulatory Visit: Payer: Self-pay | Admitting: Pharmacy Technician

## 2024-03-05 NOTE — Progress Notes (Signed)
 Specialty Pharmacy Refill Coordination Note  Amy Thomas is a 43 y.o. female contacted today regarding refills of specialty medication(s) Adalimumab  (Humira  (2 Pen))   Patient requested Delivery   Delivery date: 03/07/24   Verified address: [address] [place]   Medication will be filled on 03/06/24.

## 2024-03-31 ENCOUNTER — Other Ambulatory Visit: Payer: Self-pay

## 2024-04-02 ENCOUNTER — Other Ambulatory Visit: Payer: Self-pay

## 2024-04-02 ENCOUNTER — Other Ambulatory Visit: Payer: Self-pay | Admitting: Pharmacy Technician

## 2024-04-02 NOTE — Progress Notes (Signed)
 Specialty Pharmacy Refill Coordination Note  Temple Ewart is a 43 y.o. female contacted today regarding refills of specialty medication(s) Adalimumab  (Humira  (2 Pen))   Patient requested Delivery   Delivery date: 04/10/24   Verified address: [address]   [place] [address]   Medication will be filled on 04/09/24.  1 injection for 9/27 and 10/11,10/25

## 2024-04-03 ENCOUNTER — Other Ambulatory Visit (HOSPITAL_BASED_OUTPATIENT_CLINIC_OR_DEPARTMENT_OTHER): Payer: Self-pay

## 2024-04-03 MED ORDER — FLUZONE 0.5 ML IM SUSY
0.5000 mL | PREFILLED_SYRINGE | Freq: Once | INTRAMUSCULAR | 0 refills | Status: AC
  Filled 2024-04-03: qty 0.5, 1d supply, fill #0

## 2024-04-09 ENCOUNTER — Other Ambulatory Visit: Payer: Self-pay

## 2024-04-15 ENCOUNTER — Other Ambulatory Visit (HOSPITAL_COMMUNITY)
Admission: RE | Admit: 2024-04-15 | Discharge: 2024-04-15 | Disposition: A | Discharge status: Home / Self Care | Source: Ambulatory Visit | Attending: Family Medicine | Admitting: Family Medicine

## 2024-04-15 ENCOUNTER — Ambulatory Visit: Admitting: Family Medicine

## 2024-04-15 ENCOUNTER — Other Ambulatory Visit (HOSPITAL_BASED_OUTPATIENT_CLINIC_OR_DEPARTMENT_OTHER): Payer: Self-pay

## 2024-04-15 ENCOUNTER — Encounter: Payer: Self-pay | Admitting: Family Medicine

## 2024-04-15 VITALS — BP 137/88 | HR 70 | Ht 67.0 in | Wt 174.0 lb

## 2024-04-15 DIAGNOSIS — F419 Anxiety disorder, unspecified: Secondary | ICD-10-CM

## 2024-04-15 DIAGNOSIS — Z113 Encounter for screening for infections with a predominantly sexual mode of transmission: Secondary | ICD-10-CM

## 2024-04-15 DIAGNOSIS — Z3009 Encounter for other general counseling and advice on contraception: Secondary | ICD-10-CM

## 2024-04-15 DIAGNOSIS — I10 Essential (primary) hypertension: Secondary | ICD-10-CM

## 2024-04-15 LAB — POCT URINE PREGNANCY: Preg Test, Ur: NEGATIVE

## 2024-04-15 MED ORDER — LOSARTAN POTASSIUM 25 MG PO TABS
25.0000 mg | ORAL_TABLET | Freq: Every day | ORAL | 3 refills | Status: AC
  Filled 2024-04-15: qty 90, 90d supply, fill #0

## 2024-04-15 MED ORDER — HYDROXYZINE HCL 10 MG PO TABS
10.0000 mg | ORAL_TABLET | Freq: Three times a day (TID) | ORAL | 0 refills | Status: AC | PRN
  Filled 2024-04-15: qty 30, 5d supply, fill #0

## 2024-04-15 MED ORDER — FLUOXETINE HCL 10 MG PO CAPS
10.0000 mg | ORAL_CAPSULE | Freq: Every day | ORAL | 3 refills | Status: AC
  Filled 2024-04-15: qty 90, 90d supply, fill #0

## 2024-04-15 MED ORDER — NORETHINDRONE ACET-ETHINYL EST 1-20 MG-MCG PO TABS
1.0000 | ORAL_TABLET | Freq: Every day | ORAL | 3 refills | Status: AC
  Filled 2024-04-15: qty 84, 84d supply, fill #0

## 2024-04-15 NOTE — Assessment & Plan Note (Signed)
 Blood pressure elevated at 137/88 mmHg, home readings in 150s. Previously on losartan , currently off medication. Stress contributing to hypertension. - Restart losartan  25 mg daily. - Add kidney function tests to blood work. - F/u in 4 weeks

## 2024-04-15 NOTE — Progress Notes (Signed)
 Acute Office Visit  Subjective:     Patient ID: Amy Thomas, female    DOB: [birth year], 43 y.o.   MRN: [id number]  Chief Complaint  Patient presents with   Medical Management of Chronic Issues    HPI Patient is in today for STD screening.  Discussed the use of AI scribe software for clinical note transcription with the patient, who gave verbal consent to proceed.  History of Present Illness Amy Thomas is a 43 year old female who presents for comprehensive STD screening and management of hypertension and anxiety.  She is concerned about potential STD exposure due to rumors of her partner's infidelity and possible HSV. She has been in a monogamous relationship for the past year and has not been using protection. She has no current symptoms such as itching, lesions, or discharge.  She is interested in resuming birth control after the expulsion of her Mirena IUD and miscarriage earlier this year. She has a history of using various birth control pills for irregular periods, which are now regular. She prefers a low estrogen option due to past experiences and has no desire for future pregnancies. She is considering options for birth control and management of fibroids, which have contributed to heavy menstrual cycles.  She has a history of hypertension and previously took losartan . She reports higher blood pressure readings at home, often in the 150s. She has been off her medication for a while and is experiencing increased stress, which she feels is contributing to her elevated blood pressure.  She experiences significant anxiety, primarily related to financial stress, and reports poor sleep, averaging five to six hours per night. She has a history of using medications such as Lexapro, Prozac, and hydroxyzine for anxiety and sleep, with varying degrees of success. She has tried trazodone and Benadryl for sleep without significant improvement.  She reports  financial stress as a major trigger to her anxiety.       04/15/2024    9:00 AM 07/18/2023   10:00 AM 11/23/2021    2:22 PM  PHQ9 SCORE ONLY  PHQ-9 Total Score 6 0  0      Data saved with a previous flowsheet row definition      04/15/2024    9:00 AM  GAD 7 : Generalized Anxiety Score  Nervous, Anxious, on Edge 3  Control/stop worrying 3  Worry too much - different things 3  Trouble relaxing 3  Restless 0  Easily annoyed or irritable 3  Afraid - awful might happen 0  Total GAD 7 Score 15  Anxiety Difficulty Somewhat difficult         ROS All review of systems negative except what is listed in the HPI      Objective:    BP 137/88   Pulse 70   Ht 5' 7 (1.702 m)   Wt 174 lb (78.9 kg)   SpO2 100%   BMI 27.25 kg/m    Physical Exam Vitals reviewed.  Constitutional:      Appearance: Normal appearance.  Neurological:     Mental Status: She is alert and oriented to person, place, and time.  Psychiatric:        Mood and Affect: Mood normal.        Behavior: Behavior normal.        Thought Content: Thought content normal.        Judgment: Judgment normal.         Results for orders placed or  performed in visit on 04/15/24  POCT urine pregnancy  Result Value Ref Range   Preg Test, Ur Negative Negative        Assessment & Plan:   Problem List Items Addressed This Visit       Active Problems   Anxiety   Heightened anxiety and insomnia, 5-6 hours sleep per night. Previous trials of Lexapro, Prozac, trazodone, hydroxyzine. Open to restarting Prozac, effective in past. PHQ/GAD discussed. NO SI/HI.  - Prescribe Prozac at lowest dose, monitor for 4-6 weeks. - Advise up to two hydroxyzine at night for sleep. - Schedule follow-up in 4-6 weeks to assess mood and blood pressure. - Consider small amount of Xanax for emergency use if hydroxyzine ineffective.      Relevant Medications   FLUoxetine (PROZAC) 10 MG capsule   hydrOXYzine (ATARAX) 10 MG tablet    Hypertension   Blood pressure elevated at 137/88 mmHg, home readings in 150s. Previously on losartan , currently off medication. Stress contributing to hypertension. - Restart losartan  25 mg daily. - Add kidney function tests to blood work. - F/u in 4 weeks      Relevant Medications   losartan  (COZAAR ) 25 MG tablet   Other Relevant Orders   Basic metabolic panel with GFR   Other Visit Diagnoses       Screen for STD (sexually transmitted disease)    -  Primary   Relevant Orders   Hepatitis C antibody   RPR   HIV Antibody (routine testing w rflx)   Cervicovaginal ancillary only     Birth control counseling     Interested in starting low estrogen birth control pills post-Mirena IUD expulsion and miscarriage. Regular cycles now, prefers low estrogen due to heavy cycles and fibroids. Not planning future pregnancies, considering surgical fibroid management but unable to take time off work. - Urine pregnancy test negative today.  - Prescribe Loestrin, advise trial for 1-2 cycles. Follow-up with GYN if not working well.   Relevant Medications   norethindrone-ethinyl estradiol (LOESTRIN 1/20, 21,) 1-20 MG-MCG tablet   Other Relevant Orders   POCT urine pregnancy (Completed)           Meds ordered this encounter  Medications   losartan  (COZAAR ) 25 MG tablet    Sig: Take 1 tablet (25 mg total) by mouth daily.    Dispense:  90 tablet    Refill:  3    Supervising Provider:   DOMENICA BLACKBIRD A [[id number]]   norethindrone-ethinyl estradiol (LOESTRIN 1/20, 21,) 1-20 MG-MCG tablet    Sig: Take 1 tablet by mouth daily.    Dispense:  84 tablet    Refill:  3    Supervising Provider:   DOMENICA BLACKBIRD A [[id number]]   FLUoxetine (PROZAC) 10 MG capsule    Sig: Take 1 capsule (10 mg total) by mouth daily.    Dispense:  90 capsule    Refill:  3    Supervising Provider:   DOMENICA BLACKBIRD A [[id number]]   hydrOXYzine (ATARAX) 10 MG tablet    Sig: Take 1-2 tablets (10-20 mg total) by mouth 3 (three) times  daily as needed.    Dispense:  30 tablet    Refill:  0    Supervising Provider:   DOMENICA BLACKBIRD A [[id number]]    Return in about 4 weeks (around 05/13/2024) for mood, BP follow-up, BMP.  Waddell KATHEE Mon, NP

## 2024-04-15 NOTE — Assessment & Plan Note (Signed)
 Heightened anxiety and insomnia, 5-6 hours sleep per night. Previous trials of Lexapro, Prozac, trazodone, hydroxyzine. Open to restarting Prozac, effective in past. - Prescribe Prozac at lowest dose, monitor for 4-6 weeks. - Advise up to two hydroxyzine at night for sleep. - Schedule follow-up in 4-6 weeks to assess mood and blood pressure. - Consider small amount of Xanax for emergency use if hydroxyzine ineffective.

## 2024-04-16 LAB — CERVICOVAGINAL ANCILLARY ONLY
Bacterial Vaginitis (gardnerella): POSITIVE — AB
Candida Glabrata: NEGATIVE
Candida Vaginitis: NEGATIVE
Chlamydia: NEGATIVE
Comment: NEGATIVE
Comment: NEGATIVE
Comment: NEGATIVE
Comment: NEGATIVE
Comment: NEGATIVE
Comment: NORMAL
Neisseria Gonorrhea: NEGATIVE
Trichomonas: NEGATIVE

## 2024-04-18 ENCOUNTER — Other Ambulatory Visit (HOSPITAL_BASED_OUTPATIENT_CLINIC_OR_DEPARTMENT_OTHER): Payer: Self-pay

## 2024-04-18 ENCOUNTER — Encounter: Payer: Self-pay | Admitting: Family Medicine

## 2024-04-18 ENCOUNTER — Ambulatory Visit: Payer: Self-pay | Admitting: Family Medicine

## 2024-04-18 ENCOUNTER — Encounter (HOSPITAL_BASED_OUTPATIENT_CLINIC_OR_DEPARTMENT_OTHER): Payer: Self-pay

## 2024-04-18 DIAGNOSIS — B9689 Other specified bacterial agents as the cause of diseases classified elsewhere: Secondary | ICD-10-CM

## 2024-04-18 LAB — HIV ANTIBODY (ROUTINE TESTING W REFLEX)
HIV 1&2 Ab, 4th Generation: NONREACTIVE
HIV FINAL INTERPRETATION: NEGATIVE

## 2024-04-18 LAB — HEPATITIS C ANTIBODY: Hepatitis C Ab: NONREACTIVE

## 2024-04-18 LAB — RPR: RPR Ser Ql: NONREACTIVE

## 2024-04-18 LAB — BASIC METABOLIC PANEL WITH GFR
BUN: 12 mg/dL (ref 7–25)
CO2: 28 mmol/L (ref 20–32)
Calcium: 8.9 mg/dL (ref 8.6–10.2)
Chloride: 104 mmol/L (ref 98–110)
Creat: 0.58 mg/dL (ref 0.50–0.99)
Glucose, Bld: 87 mg/dL (ref 65–99)
Potassium: 3.8 mmol/L (ref 3.5–5.3)
Sodium: 138 mmol/L (ref 135–146)
eGFR: 115 mL/min/1.73m2 (ref >=60)

## 2024-04-18 LAB — HERPES SIMPLEX VIRUS 1 AND 2 (IGG),REFLEX HSV-2 INHIBITION
HSV 1 IGG,TYPE SPECIFIC AB: 1 {index} — ABNORMAL HIGH
HSV 2 IGG,TYPE SPECIFIC AB: 2.86 {index} — ABNORMAL HIGH

## 2024-04-18 LAB — HSV 2 INHIBITION: HSV 2 IGG INHIBITION,IA: POSITIVE — AB

## 2024-04-18 MED ORDER — METRONIDAZOLE 0.75 % VA GEL
1.0000 | Freq: Every day | VAGINAL | 0 refills | Status: AC
  Filled 2024-04-18 (×2): qty 70, 7d supply, fill #0

## 2024-04-18 MED ORDER — FLUCONAZOLE 150 MG PO TABS
150.0000 mg | ORAL_TABLET | Freq: Every day | ORAL | 0 refills | Status: AC
  Filled 2024-04-18 (×3): qty 2, 2d supply, fill #0

## 2024-04-18 MED ORDER — METRONIDAZOLE 0.75 % VA GEL: 1.0000 | Freq: Every day | VAGINAL | 0 refills | Status: DC

## 2024-04-18 MED ORDER — FLUCONAZOLE 150 MG PO TABS: 150.0000 mg | ORAL_TABLET | Freq: Every day | ORAL | 0 refills | Status: DC

## 2024-05-02 ENCOUNTER — Other Ambulatory Visit (HOSPITAL_COMMUNITY): Payer: Self-pay

## 2024-05-02 ENCOUNTER — Other Ambulatory Visit: Payer: Self-pay

## 2024-05-02 NOTE — Progress Notes (Signed)
 Specialty Pharmacy Refill Coordination Note  Amy Thomas is a 43 y.o. female contacted today regarding refills of specialty medication(s) Adalimumab  (Humira  (2 Pen))   Patient requested Delivery   Delivery date: 05/09/24   Verified address: [address]   [place] [address]   Medication will be filled on 05/08/24.

## 2024-05-08 ENCOUNTER — Other Ambulatory Visit: Payer: Self-pay

## 2024-05-13 ENCOUNTER — Ambulatory Visit: Admitting: Family Medicine

## 2024-05-13 NOTE — Progress Notes (Incomplete)
 Established Patient Office Visit  Subjective:  Patient ID: Amy Thomas, female    DOB: 26-Feb-1981  Age: 43 y.o. MRN: [id number]  CC: No chief complaint on file.     HPI Amy Thomas is here for Mood and Hypertension Follow-up.        Mood follow-up: - Diagnosis: Anxiety - Treatment: Prozac 10 mg daily and Hydroxyzine 10 - 20 mg as needed for sleep.  - Medication side effects:  - SI/HI:  - Update:      04/15/2024    9:00 AM 07/18/2023   10:00 AM  Depression screen PHQ 2/9  Decreased Interest 0 0  Down, Depressed, Hopeless 1 0  PHQ - 2 Score 1 0  Altered sleeping 3   Tired, decreased energy 0   Change in appetite 0   Feeling bad or failure about yourself  0   Trouble concentrating 2   Moving slowly or fidgety/restless 0   Suicidal thoughts 0   PHQ-9 Score 6   Difficult doing work/chores Somewhat difficult       04/15/2024    9:00 AM  GAD 7 : Generalized Anxiety Score  Nervous, Anxious, on Edge 3  Control/stop worrying 3  Worry too much - different things 3  Trouble relaxing 3  Restless 0  Easily annoyed or irritable 3  Afraid - awful might happen 0  Total GAD 7 Score 15  Anxiety Difficulty Somewhat difficult    Hypertension: - Medications: Losartan  25 mg - Compliance: *** - Checking BP at home: *** - Denies any SOB, recurrent headaches, CP, vision changes, LE edema, dizziness, palpitations, or medication side effects. - Stressors:  BP Readings from Last 3 Encounters:  04/15/24 137/88  11/08/23 122/78  09/21/23 (!) 170/109   Past Medical History:  Diagnosis Date   Anxiety    Blood transfusion without reported diagnosis    Chronic headaches    Colon polyp    Crohn's disease (HCC)    Depression    GERD (gastroesophageal reflux disease)    Heart murmur    Hypertension    Migraine    Neck pain     Past Surgical History:  Procedure Laterality Date   CERVICAL BIOPSY  W/ LOOP ELECTRODE EXCISION     to remove precancous  cells    COLONOSCOPY  2017   [facility]   LYMPHADENECTOMY Left    TONSILLECTOMY AND ADENOIDECTOMY     TONSILLECTOMY AND ADENOIDECTOMY      Family History  Problem Relation Age of Onset   Ovarian cancer Mother    Migraines Mother    Other Father        no medical history of father   Stomach cancer Maternal Uncle    Asthma Maternal Grandmother    COPD Maternal Grandmother    Hyperlipidemia Maternal Grandmother    Hypertension Maternal Grandmother    Stroke Maternal Grandmother    Uterine cancer Maternal Grandmother    Heart disease Maternal Grandmother    Diabetes Maternal Grandmother    Colon cancer Neg Hx    Esophageal cancer Neg Hx    Sleep apnea Neg Hx     Social History   Socioeconomic History   Marital status: Significant Other    Spouse name: Not on file   Number of children: 2   Years of education: college   Highest education level: Not on file  Occupational History   Occupation: X-Ray Tech  Tobacco Use   Smoking status: Former  Current packs/day: 0.00    Types: Cigarettes    Quit date: 03/10/2014    Years since quitting: 10.1   Smokeless tobacco: Never  Vaping Use   Vaping status: Never Used  Substance and Sexual Activity   Alcohol use: Yes    Comment: occasionally   Drug use: Not Currently    Types: Marijuana   Sexual activity: Yes  Other Topics Concern   Not on file  Social History Narrative   Lives with husband, son and daughter.   Right-handed.   Caffeine use: two cups per day.   Social Drivers of Corporate Investment Banker Strain: Not on file  Food Insecurity: Not on file  Transportation Needs: Not on file  Physical Activity: Not on file  Stress: Not on file  Social Connections: Unknown (11/21/2021)   Received from [facility]   Social Network    Social Network: Not on file  Intimate Partner Violence: Unknown (10/13/2021)   Received from [facility]   HITS    Physically Hurt: Not on file    Insult or Talk Down To:  Not on file    Threaten Physical Harm: Not on file    Scream or Curse: Not on file    ROS All ROS negative except what is listed in the HPI.   Objective:   Today's Vitals: There were no vitals taken for this visit.  Physical Exam  Assessment & Plan:   Problem List Items Addressed This Visit   None     Follow-up: No follow-ups on file.   Amy FURY Almarie, DNP, FNP-C  I,Emily Lagle,acting as a neurosurgeon for Amy KATHEE Almarie, NP.,have documented all relevant documentation on the behalf of Amy KATHEE Almarie, NP.  I, Amy KATHEE Almarie, NP, have reviewed all documentation for this visit. The documentation on 05/13/2024 for the exam, diagnosis, procedures, and orders are all accurate and complete.

## 2024-05-28 ENCOUNTER — Other Ambulatory Visit: Payer: Self-pay

## 2024-05-29 ENCOUNTER — Other Ambulatory Visit (HOSPITAL_COMMUNITY): Payer: Self-pay

## 2024-06-04 ENCOUNTER — Other Ambulatory Visit (HOSPITAL_COMMUNITY): Payer: Self-pay

## 2024-06-06 ENCOUNTER — Other Ambulatory Visit: Payer: Self-pay

## 2024-06-10 ENCOUNTER — Other Ambulatory Visit: Payer: Self-pay

## 2024-06-22 ENCOUNTER — Emergency Department (HOSPITAL_BASED_OUTPATIENT_CLINIC_OR_DEPARTMENT_OTHER)

## 2024-06-22 ENCOUNTER — Emergency Department (HOSPITAL_BASED_OUTPATIENT_CLINIC_OR_DEPARTMENT_OTHER)
Admission: EM | Admit: 2024-06-22 | Discharge: 2024-06-22 | Disposition: A | Discharge status: Home / Self Care | Attending: Emergency Medicine | Admitting: Emergency Medicine

## 2024-06-22 ENCOUNTER — Other Ambulatory Visit: Payer: Self-pay

## 2024-06-22 ENCOUNTER — Encounter (HOSPITAL_BASED_OUTPATIENT_CLINIC_OR_DEPARTMENT_OTHER): Payer: Self-pay

## 2024-06-22 DIAGNOSIS — K5090 Crohn's disease, unspecified, without complications: Secondary | ICD-10-CM | POA: Diagnosis not present

## 2024-06-22 DIAGNOSIS — D649 Anemia, unspecified: Secondary | ICD-10-CM | POA: Diagnosis not present

## 2024-06-22 DIAGNOSIS — K50119 Crohn's disease of large intestine with unspecified complications: Secondary | ICD-10-CM | POA: Insufficient documentation

## 2024-06-22 DIAGNOSIS — R590 Localized enlarged lymph nodes: Secondary | ICD-10-CM | POA: Diagnosis not present

## 2024-06-22 DIAGNOSIS — D259 Leiomyoma of uterus, unspecified: Secondary | ICD-10-CM | POA: Diagnosis not present

## 2024-06-22 LAB — COMPREHENSIVE METABOLIC PANEL WITH GFR
ALT: 19 U/L (ref 0–44)
AST: 24 U/L (ref 15–41)
Albumin: 3.9 g/dL (ref 3.5–5.0)
Alkaline Phosphatase: 56 U/L (ref 38–126)
Anion gap: 10 (ref 5–15)
BUN: 17 mg/dL (ref 6–20)
CO2: 24 mmol/L (ref 22–32)
Calcium: 8.7 mg/dL — ABNORMAL LOW (ref 8.9–10.3)
Chloride: 106 mmol/L (ref 98–111)
Creatinine, Ser: 0.65 mg/dL (ref 0.44–1.00)
GFR, Estimated: >60 mL/min (ref >60)
Glucose, Bld: 126 mg/dL — ABNORMAL HIGH (ref 70–99)
Potassium: 3.4 mmol/L — ABNORMAL LOW (ref 3.5–5.1)
Sodium: 140 mmol/L (ref 135–145)
Total Bilirubin: 0.2 mg/dL (ref 0.0–1.2)
Total Protein: 7 g/dL (ref 6.5–8.1)

## 2024-06-22 LAB — LIPASE, BLOOD: Lipase: 85 U/L — ABNORMAL HIGH (ref 11–51)

## 2024-06-22 LAB — CBC
HCT: 25.6 % — ABNORMAL LOW (ref 36.0–46.0)
Hemoglobin: 7.5 g/dL — ABNORMAL LOW (ref 12.0–15.0)
MCH: 19 pg — ABNORMAL LOW (ref 26.0–34.0)
MCHC: 29.3 g/dL — ABNORMAL LOW (ref 30.0–36.0)
MCV: 65 fL — ABNORMAL LOW (ref 80.0–100.0)
Platelets: 451 K/uL — ABNORMAL HIGH (ref 150–400)
RBC: 3.94 MIL/uL (ref 3.87–5.11)
RDW: 18.6 % — ABNORMAL HIGH (ref 11.5–15.5)
WBC: 9.1 K/uL (ref 4.0–10.5)
nRBC: 0 % (ref 0.0–0.2)

## 2024-06-22 LAB — C-REACTIVE PROTEIN: CRP: 2.3 mg/dL — ABNORMAL HIGH (ref <1.0)

## 2024-06-22 LAB — HCG, SERUM, QUALITATIVE: Preg, Serum: NEGATIVE

## 2024-06-22 MED ORDER — MORPHINE SULFATE (PF) 4 MG/ML IV SOLN: 4.0000 mg | Freq: Once | INTRAVENOUS | Status: DC

## 2024-06-22 MED ORDER — IOHEXOL 300 MG/ML  SOLN
80.0000 mL | Freq: Once | INTRAMUSCULAR | Status: AC | PRN
  Administered 2024-06-22: 80 mL via INTRAVENOUS

## 2024-06-22 MED ORDER — PROMETHAZINE HCL 25 MG PO TABS: 25.0000 mg | ORAL_TABLET | Freq: Four times a day (QID) | ORAL | 0 refills | Status: AC | PRN

## 2024-06-22 MED ORDER — HYDROMORPHONE HCL 1 MG/ML IJ SOLN
1.0000 mg | Freq: Once | INTRAMUSCULAR | Status: AC
  Administered 2024-06-22: 1 mg via INTRAVENOUS
  Filled 2024-06-22: qty 1

## 2024-06-22 MED ORDER — PREDNISONE 10 MG PO TABS: ORAL_TABLET | ORAL | 0 refills | Status: AC

## 2024-06-22 MED ORDER — HYDROCODONE-ACETAMINOPHEN 5-325 MG PO TABS: 1.0000 | ORAL_TABLET | Freq: Four times a day (QID) | ORAL | 0 refills | Status: AC | PRN

## 2024-06-22 MED ORDER — METOCLOPRAMIDE HCL 5 MG/ML IJ SOLN
10.0000 mg | Freq: Once | INTRAMUSCULAR | Status: AC
  Administered 2024-06-22: 10 mg via INTRAVENOUS
  Filled 2024-06-22: qty 2

## 2024-06-22 MED ORDER — ONDANSETRON HCL 4 MG/2ML IJ SOLN: 4.0000 mg | Freq: Once | INTRAMUSCULAR | Status: DC | PRN

## 2024-06-22 MED ORDER — FERROUS SULFATE 325 (65 FE) MG PO TABS: 325.0000 mg | ORAL_TABLET | Freq: Every day | ORAL | 0 refills | Status: AC

## 2024-06-22 MED ORDER — PREDNISONE 20 MG PO TABS
40.0000 mg | ORAL_TABLET | Freq: Once | ORAL | Status: AC
  Administered 2024-06-22: 40 mg via ORAL
  Filled 2024-06-22: qty 2

## 2024-06-22 NOTE — ED Provider Notes (Signed)
 [place] [facility]  Provider Note  CSN: [id number] Arrival date & time: 06/22/24 0417  History Chief Complaint  Patient presents with   Abdominal Pain    Amy Thomas is a 43 y.o. female with history of crohn's on Humira  q 2weeks, reports 3-4 days of increasing RLQ pain with nausea but no fever, no diarrhea. No change in usual hematochezia. 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 She has never required surgery.    Home Medications Prior to Admission medications  Medication Sig Start Date End Date Taking? Authorizing Provider  ferrous sulfate  325 (65 FE) MG tablet Take 1 tablet (325 mg total) by mouth daily. 06/22/24  Yes Roselyn Carlin NOVAK, MD  HYDROcodone -acetaminophen  (NORCO/VICODIN) 5-325 MG tablet Take 1 tablet by mouth every 6 (six) hours as needed for severe pain (pain score 7-10). 06/22/24  Yes Roselyn Carlin NOVAK, MD  predniSONE  (DELTASONE ) 10 MG tablet Take 4 tablets (40 mg total) by mouth daily for 14 days, THEN 3 tablets (30 mg total) daily for 14 days, THEN 2 tablets (20 mg total) daily for 14 days, THEN 1 tablet (10 mg total) daily for 14 days. 06/22/24 08/17/24 Yes Roselyn Carlin NOVAK, MD  promethazine  (PHENERGAN ) 25 MG tablet Take 1 tablet (25 mg total) by mouth every 6 (six) hours as needed for nausea or vomiting. 06/22/24  Yes Roselyn Carlin NOVAK, MD  adalimumab  (HUMIRA , 2 PEN,) 40 MG/0.8ML AJKT pen Inject 0.8 mLs (40 mg total) into the skin every 14 (fourteen) days. 11/09/23   Jegede, Olugbemiga E, MD  fluconazole  (DIFLUCAN ) 150 MG tablet Take 1 tablet (150 mg total) by mouth daily. May repeat in 3 days if needed. 04/18/24   Almarie Waddell NOVAK, NP  FLUoxetine  (PROZAC ) 10 MG capsule Take 1 capsule (10 mg total) by mouth daily. 04/15/24   Almarie Waddell NOVAK, NP  hydrOXYzine  (ATARAX ) 10 MG tablet Take 1-2 tablets (10-20 mg total) by mouth 3 (three) times daily as needed. 04/15/24   Almarie Waddell NOVAK, NP  losartan   (COZAAR ) 25 MG tablet Take 1 tablet (25 mg total) by mouth daily. 04/15/24   Almarie Waddell NOVAK, NP  norethindrone -ethinyl estradiol  (LOESTRIN  1/20, 21,) 1-20 MG-MCG tablet Take 1 tablet by mouth daily. 04/15/24   Almarie Waddell NOVAK, NP     Allergies    Cefaclor   Review of Systems   Review of Systems Please see HPI for pertinent positives and negatives  Physical Exam BP (!) 138/99   Pulse 84   Temp 98.4 F (36.9 C) (Oral)   Resp 18   Ht 5' 7 (1.702 m)   Wt 72.6 kg   SpO2 96%   BMI 25.06 kg/m   Physical Exam Vitals and nursing note reviewed.  Constitutional:      Appearance: Normal appearance.  HENT:     Head: Normocephalic and atraumatic.     Nose: Nose normal.     Mouth/Throat:     Mouth: Mucous membranes are moist.  Eyes:     Extraocular Movements: Extraocular movements intact.     Conjunctiva/sclera: Conjunctivae normal.  Cardiovascular:     Rate and Rhythm: Normal rate.  Pulmonary:     Effort: Pulmonary effort is normal.     Breath sounds: Normal breath sounds.  Abdominal:     General: Abdomen is flat.     Palpations: Abdomen is soft.     Tenderness: There is abdominal tenderness in the right lower quadrant. There is guarding. Positive  signs include Murphy's sign. Negative signs include McBurney's sign.  Musculoskeletal:        General: No swelling. Normal range of motion.     Cervical back: Neck supple.  Skin:    General: Skin is warm and dry.  Neurological:     General: No focal deficit present.     Mental Status: She is alert.  Psychiatric:        Mood and Affect: Mood normal.     ED Results / Procedures / Treatments   EKG None  Procedures Procedures  Medications Ordered in the ED Medications  predniSONE  (DELTASONE ) tablet 40 mg (has no administration in time range)  HYDROmorphone  (DILAUDID ) injection 1 mg (1 mg Intravenous Given 06/22/24 0451)  metoCLOPramide  (REGLAN ) injection 10 mg (10 mg Intravenous Given 06/22/24 0451)  iohexol  (OMNIPAQUE )  300 MG/ML solution 80 mL (80 mLs Intravenous Contrast Given 06/22/24 0600)    Initial Impression and Plan  Patient here with RLQ pain, concern for crohn's flare. Vitals are reassuring, exam with tenderness in RLQ, will check labs and send for CT. Pain/nausea meds for comfort.   ED Course   Clinical Course as of 06/22/24 0641  Sun Jun 22, 2024  0511 CBC with anemia, worse from baseline but no in need for transfusion. WBC is normal.  [CS]  0522 CMP and lipase unremarkable.  [CS]  0547 HCG is neg.  [CS]  [id number] I personally viewed the images from radiology studies and agree with radiologist interpretation: CT shows ileocecal inflammatory changes and some proximal dilation. Appendix not likely to be source, particularly given history of similar. Will discuss with GI. She would prefer to go home if possible.  [CS]  [phone] Spoke with Dr. Charlanne, GI, who recommends starting a long steroid taper (40mg  x 2 weeks and decrease by 10mg  every 2 weeks). He will see her in office later this week.  [CS]    Clinical Course User Index [CS] Roselyn Carlin NOVAK, MD     MDM Rules/Calculators/A&P Medical Decision Making Problems Addressed: Chronic anemia: chronic illness or injury with exacerbation, progression, or side effects of treatment Crohn's disease of colon with complication [facility]): chronic illness or injury with exacerbation, progression, or side effects of treatment  Amount and/or Complexity of Data Reviewed Labs: ordered. Decision-making details documented in ED Course. Radiology: ordered and independent interpretation performed. Decision-making details documented in ED Course.  Risk OTC drugs. Prescription drug management. Parenteral controlled substances. Decision regarding hospitalization.     Final Clinical Impression(s) / ED Diagnoses Final diagnoses:  Crohn's disease of colon with complication (HCC)  Chronic anemia    Rx / DC Orders ED Discharge Orders          Ordered     predniSONE  (DELTASONE ) 10 MG tablet  Daily        06/22/24 0641    ferrous sulfate  325 (65 FE) MG tablet  Daily        06/22/24 0641    HYDROcodone -acetaminophen  (NORCO/VICODIN) 5-325 MG tablet  Every 6 hours PRN        06/22/24 0641    promethazine  (PHENERGAN ) 25 MG tablet  Every 6 hours PRN        06/22/24 0641             Roselyn Carlin NOVAK, MD 06/22/24 [phone]

## 2024-06-22 NOTE — ED Notes (Signed)

## 2024-06-22 NOTE — ED Notes (Signed)
 Pt has went to CT

## 2024-06-22 NOTE — ED Triage Notes (Addendum)
 Patient believes she is having a crohn's flare up that started Friday.  States that she wants to be checked for a blockage. Complaints of RLQ pain, nausea, urgency. Last BM: 06/21/24

## 2024-06-22 NOTE — ED Notes (Signed)
 Patient stated that she will sign pregnancy waiver for CT scan because she is currently on her period. Notified CT staff.

## 2024-06-24 ENCOUNTER — Telehealth: Payer: Self-pay

## 2024-06-24 NOTE — Telephone Encounter (Signed)
 Pt was contacted and made aware Dr. Charlanne recommendations.  Pt stated that she was driving and would call back after she gets off work today to schedule the appointment.

## 2024-06-25 NOTE — Telephone Encounter (Signed)
 Left message for pt to call back

## 2024-06-26 NOTE — Telephone Encounter (Signed)
Left message for patient to call back.  Mychart message sent to pt.

## 2024-06-30 NOTE — Telephone Encounter (Signed)
 Left message for pt to call back

## 2024-07-01 NOTE — Telephone Encounter (Signed)
Letter mailed home to patient.

## 2024-08-11 ENCOUNTER — Other Ambulatory Visit: Payer: Self-pay
